# Patient Record
Sex: Male | Born: 1955 | Race: White | Hispanic: No | Marital: Single | Smoking: Never smoker
Health system: Southern US, Community
[De-identification: ages and names within clinical notes are randomized; demographics above are authoritative.]

## PROBLEM LIST (undated history)

## (undated) DIAGNOSIS — K5190 Ulcerative colitis, unspecified, without complications: Secondary | ICD-10-CM

## (undated) DIAGNOSIS — I10 Essential (primary) hypertension: Secondary | ICD-10-CM

## (undated) DIAGNOSIS — Q909 Down syndrome, unspecified: Secondary | ICD-10-CM

## (undated) HISTORY — PX: OTHER SURGICAL HISTORY: SHX169

---

## 2005-11-17 ENCOUNTER — Ambulatory Visit: Payer: Self-pay | Admitting: Nephrology

## 2005-11-27 ENCOUNTER — Emergency Department: Payer: Self-pay | Admitting: Emergency Medicine

## 2009-05-01 ENCOUNTER — Ambulatory Visit: Payer: Self-pay | Admitting: Specialist

## 2013-09-07 ENCOUNTER — Emergency Department: Payer: Self-pay | Admitting: Emergency Medicine

## 2013-09-07 LAB — COMPREHENSIVE METABOLIC PANEL
ALBUMIN: 4.2 g/dL (ref 3.4–5.0)
ALT: 20 U/L (ref 12–78)
ANION GAP: 6 — AB (ref 7–16)
AST: 21 U/L (ref 15–37)
Alkaline Phosphatase: 100 U/L
BILIRUBIN TOTAL: 0.4 mg/dL (ref 0.2–1.0)
BUN: 12 mg/dL (ref 7–18)
CREATININE: 0.83 mg/dL (ref 0.60–1.30)
Calcium, Total: 9 mg/dL (ref 8.5–10.1)
Chloride: 102 mmol/L (ref 98–107)
Co2: 29 mmol/L (ref 21–32)
EGFR (Non-African Amer.): >60
Glucose: 116 mg/dL — ABNORMAL HIGH (ref 65–99)
OSMOLALITY: 275 (ref 275–301)
POTASSIUM: 4.3 mmol/L (ref 3.5–5.1)
Sodium: 137 mmol/L (ref 136–145)
Total Protein: 8.2 g/dL (ref 6.4–8.2)

## 2013-09-07 LAB — CBC
HCT: 49.2 % (ref 40.0–52.0)
HCT: 39.1 % — ABNORMAL LOW (ref 40.0–52.0)
HGB: 16.8 g/dL (ref 13.0–18.0)
HGB: 13.7 g/dL (ref 13.0–18.0)
MCH: 30.8 pg (ref 26.0–34.0)
MCH: 31.2 pg (ref 26.0–34.0)
MCHC: 34.3 g/dL (ref 32.0–36.0)
MCHC: 35 g/dL (ref 32.0–36.0)
MCV: 90 fL (ref 80–100)
MCV: 89 fL (ref 80–100)
Platelet: 168 10*3/uL (ref 150–440)
Platelet: 154 10*3/uL (ref 150–440)
RBC: 5.47 10*6/uL (ref 4.40–5.90)
RBC: 4.39 10*6/uL — ABNORMAL LOW (ref 4.40–5.90)
RDW: 13.5 % (ref 11.5–14.5)
RDW: 13.7 % (ref 11.5–14.5)
WBC: 9.8 10*3/uL (ref 3.8–10.6)
WBC: 17.3 10*3/uL — ABNORMAL HIGH (ref 3.8–10.6)

## 2013-09-07 LAB — PROTIME-INR
INR: 0.9
Prothrombin Time: 12.3 secs (ref 11.5–14.7)

## 2015-06-09 ENCOUNTER — Encounter: Payer: Self-pay | Admitting: Emergency Medicine

## 2015-06-09 ENCOUNTER — Observation Stay
Admission: EM | Admit: 2015-06-09 | Discharge: 2015-06-11 | Disposition: A | Discharge status: Home / Self Care | Payer: Medicare Other | Attending: Internal Medicine | Admitting: Internal Medicine

## 2015-06-09 ENCOUNTER — Emergency Department: Payer: Medicare Other

## 2015-06-09 DIAGNOSIS — Q909 Down syndrome, unspecified: Secondary | ICD-10-CM | POA: Insufficient documentation

## 2015-06-09 DIAGNOSIS — F0390 Unspecified dementia without behavioral disturbance: Secondary | ICD-10-CM | POA: Diagnosis not present

## 2015-06-09 DIAGNOSIS — I10 Essential (primary) hypertension: Secondary | ICD-10-CM | POA: Insufficient documentation

## 2015-06-09 DIAGNOSIS — Z8701 Personal history of pneumonia (recurrent): Secondary | ICD-10-CM | POA: Diagnosis not present

## 2015-06-09 DIAGNOSIS — Z79899 Other long term (current) drug therapy: Secondary | ICD-10-CM | POA: Insufficient documentation

## 2015-06-09 DIAGNOSIS — G9341 Metabolic encephalopathy: Secondary | ICD-10-CM | POA: Diagnosis not present

## 2015-06-09 DIAGNOSIS — K5190 Ulcerative colitis, unspecified, without complications: Secondary | ICD-10-CM | POA: Insufficient documentation

## 2015-06-09 DIAGNOSIS — R5383 Other fatigue: Secondary | ICD-10-CM | POA: Insufficient documentation

## 2015-06-09 DIAGNOSIS — B9620 Unspecified Escherichia coli [E. coli] as the cause of diseases classified elsewhere: Secondary | ICD-10-CM | POA: Insufficient documentation

## 2015-06-09 DIAGNOSIS — R509 Fever, unspecified: Secondary | ICD-10-CM | POA: Insufficient documentation

## 2015-06-09 DIAGNOSIS — R4182 Altered mental status, unspecified: Secondary | ICD-10-CM | POA: Insufficient documentation

## 2015-06-09 DIAGNOSIS — A419 Sepsis, unspecified organism: Secondary | ICD-10-CM | POA: Diagnosis present

## 2015-06-09 DIAGNOSIS — N390 Urinary tract infection, site not specified: Principal | ICD-10-CM | POA: Insufficient documentation

## 2015-06-09 DIAGNOSIS — G809 Cerebral palsy, unspecified: Secondary | ICD-10-CM | POA: Diagnosis not present

## 2015-06-09 HISTORY — DX: Down syndrome, unspecified: Q90.9

## 2015-06-09 HISTORY — DX: Ulcerative colitis, unspecified, without complications: K51.90

## 2015-06-09 LAB — URINALYSIS COMPLETE WITH MICROSCOPIC (ARMC ONLY)
BILIRUBIN URINE: NEGATIVE
Glucose, UA: NEGATIVE mg/dL
HGB URINE DIPSTICK: NEGATIVE
Ketones, ur: NEGATIVE mg/dL
Nitrite: NEGATIVE
PH: 6 (ref 5.0–8.0)
PROTEIN: NEGATIVE mg/dL
Specific Gravity, Urine: 1.017 (ref 1.005–1.030)

## 2015-06-09 LAB — CBC
HEMATOCRIT: 49.4 % (ref 40.0–52.0)
HEMOGLOBIN: 16.2 g/dL (ref 13.0–18.0)
MCH: 29.4 pg (ref 26.0–34.0)
MCHC: 32.9 g/dL (ref 32.0–36.0)
MCV: 89.4 fL (ref 80.0–100.0)
Platelets: 141 10*3/uL — ABNORMAL LOW (ref 150–440)
RBC: 5.52 MIL/uL (ref 4.40–5.90)
RDW: 13.9 % (ref 11.5–14.5)
WBC: 16 10*3/uL — ABNORMAL HIGH (ref 3.8–10.6)

## 2015-06-09 LAB — BASIC METABOLIC PANEL
ANION GAP: 8 (ref 5–15)
BUN: 12 mg/dL (ref 6–20)
CALCIUM: 8.9 mg/dL (ref 8.9–10.3)
CO2: 29 mmol/L (ref 22–32)
Chloride: 103 mmol/L (ref 101–111)
Creatinine, Ser: 0.68 mg/dL (ref 0.61–1.24)
GFR calc Af Amer: >60 mL/min (ref >60)
GFR calc non Af Amer: >60 mL/min (ref >60)
GLUCOSE: 130 mg/dL — AB (ref 65–99)
Potassium: 3.9 mmol/L (ref 3.5–5.1)
SODIUM: 140 mmol/L (ref 135–145)

## 2015-06-09 LAB — TROPONIN I: TROPONIN I: 0.03 ng/mL (ref <0.031)

## 2015-06-09 MED ORDER — SODIUM CHLORIDE 0.9 % IV BOLUS (SEPSIS): 500.0000 mL | Freq: Once | INTRAVENOUS | Status: DC

## 2015-06-09 MED ORDER — SODIUM CHLORIDE 0.9 % IV BOLUS (SEPSIS)
1701.0000 mL | Freq: Once | INTRAVENOUS | Status: AC
  Administered 2015-06-10: 1701 mL via INTRAVENOUS

## 2015-06-09 MED ORDER — CEFTRIAXONE SODIUM 1 G IJ SOLR
1.0000 g | Freq: Once | INTRAMUSCULAR | Status: AC
  Administered 2015-06-10: 1 g via INTRAVENOUS
  Filled 2015-06-09: qty 10

## 2015-06-09 NOTE — ED Notes (Signed)
father states pt had a fever, rapid heart rate and high blood pressure at home prior to arrival, was seen at [facility] with a low grade fever dx aspiration Pnuemonia and placed on ABX father states believes the same thing has happened

## 2015-06-09 NOTE — ED Provider Notes (Signed)
[facility] Emergency Department Provider Note  ____________________________________________  Time seen: Approximately 10:43 PM  I have reviewed the triage vital signs and the nursing notes.   HISTORY  Chief Complaint Hypertension  Caveat-history of present illness review systems Limited due to the patient's mental retardation. All information is obtained from his father/caregiver at bedside.  HPI Carlos Price is a 59 y.o. male history of ulcerative colitis, cerebral palsy, mental retardation, develop mental delay, dementia, hypertension who presents for fever and lethargy today. Father reports that the patient has had poor appetite today, has appeared fatigued, not responding than way he usually would. He developed a temperature of "100" and heart rate in the 120s as well as hypertension was concerned his father/caregiver and so he was brought to the emergency department. In the past he has had a similar presentation in the setting of aspiration pneumonia. He was treated for that last month and appeared to be doing well recently. He has had no nausea or vomiting, no perceived abdominal pain or chest pain. He has had soft stools but no diarrhea, no blood in stools.   Past Medical History  Diagnosis Date  . Ulcerative colitis (HCC)   . Down syndrome     Patient Active Problem List   Diagnosis Date Noted  . UTI (lower urinary tract infection) 06/10/2015    No past surgical history on file.  Current Outpatient Rx  Name  Route  Sig  Dispense  Refill  . mesalamine (PENTASA) 500 MG CR capsule   Oral   Take 2,000 mg by mouth 2 (two) times daily.         . mesalamine (ROWASA) 4 G enema   Rectal   Place 4 g rectally at bedtime as needed (Rectal bleeding).          Marland Kitchen spironolactone (ALDACTONE) 100 MG tablet   Oral   Take 100 mg by mouth daily. Patient has tablet compounded into 5mL suspension at [facility]         . valsartan (DIOVAN) 80 MG  tablet   Oral   Take 80-160 mg by mouth 2 (two) times daily.  in the morning and 80 in the evening           Allergies Review of patient's allergies indicates no known allergies.  No family history on file.  Social History Social History  Substance Use Topics  . Smoking status: Never Smoker   . Smokeless tobacco: Not on file  . Alcohol Use: No    Review of Systems Constitutional: + low grade fever Cardiovascular: No perceived chest pain. Respiratory: No perceived shortness of breath. Gastrointestinal: No nausea, no vomiting.  No diarrhea.   Skin: Negative for rash.   Caveat-history of present illness review systems Limited due to the patient's mental retardation. All information is obtained from his father/caregiver at bedside. ____________________________________________   PHYSICAL EXAM:  VITAL SIGNS: ED Triage Vitals  Enc Vitals Group     BP 06/09/15 1930 174/90 mmHg     Pulse Rate 06/09/15 1930 96     Resp 06/09/15 1930 18     Temp 06/09/15 1930 97.9 F (36.6 C)     Temp Source 06/09/15 1930 Oral     SpO2 06/09/15 1930 94 %     Weight 06/09/15 1930 125 lb (56.7 kg)     Height 06/09/15 1930  (1.473 m)     Head Cir --      Peak Flow --  Pain Score --      Pain Loc --      Pain Edu? --      Excl. in GC? --     Constitutional: Alert, smiles intermittently but unable to verbalize for the most part or follow commands. Eyes: Conjunctivae are normal. PERRL. EOMI. Head: Atraumatic. Nose: No congestion/rhinnorhea. Mouth/Throat: Mucous membranes are moist.  Oropharynx non-erythematous. Neck: No stridor. Cardiovascular: Normal rate, regular rhythm. Grossly normal heart sounds.  Good peripheral circulation. Respiratory: Normal respiratory effort.  No retractions. Lungs CTAB. Gastrointestinal: Soft and nontender. No distention.  No CVA tenderness. Genitourinary: deferred Musculoskeletal: No lower extremity tenderness nor edema.  No joint  effusions. Neurologic: The patient utters a few words, does not follow commands, has chronic muscle wasting and weakness in all 4 extremities which is at baseline according to his father who is at bedside. Skin:  Skin is warm, dry and intact. No rash noted.   ____________________________________________   LABS (all labs ordered are listed, but only abnormal results are displayed)  Labs Reviewed  BASIC METABOLIC PANEL - Abnormal; Notable for the following:    Glucose, Bld 130 (*)    All other components within normal limits  CBC - Abnormal; Notable for the following:    WBC 16.0 (*)    Platelets 141 (*)    All other components within normal limits  URINALYSIS COMPLETEWITH MICROSCOPIC ([facility] ONLY) - Abnormal; Notable for the following:    Color, Urine YELLOW (*)    APPearance CLOUDY (*)    Leukocytes, UA 3+ (*)    Bacteria, UA RARE (*)    Squamous Epithelial / LPF 0-5 (*)    All other components within normal limits  CULTURE, BLOOD (ROUTINE X 2)  CULTURE, BLOOD (ROUTINE X 2)  URINE CULTURE  TROPONIN I  LACTIC ACID, PLASMA  LACTIC ACID, PLASMA   ____________________________________________  EKG  ED ECG REPORT I, Gayla DossGayle, Tacarra Justo A, the attending physician, personally viewed and interpreted this ECG.   Date: 06/09/2015  EKG Time: 20:28  Rate: 93  Rhythm: normal EKG, normal sinus rhythm  Axis: normal  Intervals:none  ST&T Change: No acute ST elevation  ____________________________________________  RADIOLOGY  CXR IMPRESSION: No acute abnormality seen. ____________________________________________   PROCEDURES  Procedure(s) performed: None  Critical Care performed: Yes, see critical care note(s). Total critical care time spent 30 minutes.  ____________________________________________   INITIAL IMPRESSION / ASSESSMENT AND PLAN / ED COURSE  Pertinent labs & imaging results that were available during my care of the patient were reviewed by me and considered in my  medical decision making (see chart for details).  Carlos Price is a 59 y.o. male history of ulcerative colitis, cerebral palsy, mental retardation, develop mental delay, dementia, hypertension who presents for fever and lethargy today. On arrival to the emergency department, he is afebrile but intermittent lean tachycardic and tachypnea with elevated white blood cell count at 16,000. According to his father, he is at his baseline in terms of mental status. He is maintaining adequate blood pressure. Normal BMP. Negative troponin. Urinalysis is concerning for urinary tract infection and I suspect sepsis secondary to urinary tract infection. I rechecked ceftriaxone and liberal IV fluids ordered. Chest x-ray clear. Case discussed with the hospitalist, Dr. Clint GuyHower, for admission. ____________________________________________   FINAL CLINICAL IMPRESSION(S) / ED DIAGNOSES  Final diagnoses:  Sepsis secondary to UTI (HCC)      Gayla DossEryka A Jontrell Bushong, MD 06/10/15 [phone]

## 2015-06-10 ENCOUNTER — Encounter: Payer: Self-pay | Admitting: Internal Medicine

## 2015-06-10 DIAGNOSIS — N390 Urinary tract infection, site not specified: Secondary | ICD-10-CM | POA: Diagnosis present

## 2015-06-10 LAB — CBC
HCT: 41.7 % (ref 40.0–52.0)
Hemoglobin: 14 g/dL (ref 13.0–18.0)
MCH: 30 pg (ref 26.0–34.0)
MCHC: 33.5 g/dL (ref 32.0–36.0)
MCV: 89.7 fL (ref 80.0–100.0)
Platelets: 131 10*3/uL — ABNORMAL LOW (ref 150–440)
RBC: 4.65 MIL/uL (ref 4.40–5.90)
RDW: 13.9 % (ref 11.5–14.5)
WBC: 15.5 10*3/uL — ABNORMAL HIGH (ref 3.8–10.6)

## 2015-06-10 LAB — CREATININE, SERUM
Creatinine, Ser: 0.69 mg/dL (ref 0.61–1.24)
GFR calc Af Amer: >60 mL/min (ref >60)
GFR calc non Af Amer: >60 mL/min (ref >60)

## 2015-06-10 LAB — LACTIC ACID, PLASMA
Lactic Acid, Venous: 1.1 mmol/L (ref 0.5–2.0)
Lactic Acid, Venous: 1.9 mmol/L (ref 0.5–2.0)

## 2015-06-10 LAB — TSH: TSH: 1.042 u[IU]/mL (ref 0.350–4.500)

## 2015-06-10 MED ORDER — MESALAMINE 4 G RE ENEM
4.0000 g | ENEMA | Freq: Every evening | RECTAL | Status: DC | PRN
  Filled 2015-06-10: qty 60

## 2015-06-10 MED ORDER — ACETAMINOPHEN 650 MG RE SUPP: 650.0000 mg | Freq: Four times a day (QID) | RECTAL | Status: DC | PRN

## 2015-06-10 MED ORDER — MORPHINE SULFATE (PF) 2 MG/ML IV SOLN: 1.0000 mg | INTRAVENOUS | Status: DC | PRN

## 2015-06-10 MED ORDER — ONDANSETRON HCL 4 MG PO TABS: 4.0000 mg | ORAL_TABLET | Freq: Four times a day (QID) | ORAL | Status: DC | PRN

## 2015-06-10 MED ORDER — IRBESARTAN 150 MG PO TABS
150.0000 mg | ORAL_TABLET | Freq: Every day | ORAL | Status: DC
  Administered 2015-06-10 – 2015-06-11 (×2): 150 mg via ORAL
  Filled 2015-06-10: qty 1

## 2015-06-10 MED ORDER — ONDANSETRON HCL 4 MG/2ML IJ SOLN: 4.0000 mg | Freq: Four times a day (QID) | INTRAMUSCULAR | Status: DC | PRN

## 2015-06-10 MED ORDER — DEXTROSE 5 % IV SOLN
1.0000 g | INTRAVENOUS | Status: DC
  Administered 2015-06-10: 1 g via INTRAVENOUS
  Filled 2015-06-10 (×2): qty 10

## 2015-06-10 MED ORDER — LABETALOL HCL 5 MG/ML IV SOLN
20.0000 mg | INTRAVENOUS | Status: DC | PRN
  Administered 2015-06-10: 20 mg via INTRAVENOUS
  Filled 2015-06-10 (×2): qty 4

## 2015-06-10 MED ORDER — ACETAMINOPHEN 325 MG PO TABS
650.0000 mg | ORAL_TABLET | Freq: Four times a day (QID) | ORAL | Status: DC | PRN
  Filled 2015-06-10: qty 2

## 2015-06-10 MED ORDER — IRBESARTAN 150 MG PO TABS: 75.0000 mg | ORAL_TABLET | Freq: Every evening | ORAL | Status: DC

## 2015-06-10 MED ORDER — SODIUM CHLORIDE 0.9 % IV SOLN
INTRAVENOUS | Status: DC
  Administered 2015-06-10 (×2): via INTRAVENOUS

## 2015-06-10 MED ORDER — SPIRONOLACTONE 5 MG/ML ORAL SUSPENSION
100.0000 mg | Freq: Every day | ORAL | Status: DC
  Administered 2015-06-10 – 2015-06-11 (×2): 100 mg via ORAL
  Filled 2015-06-10 (×2): qty 20

## 2015-06-10 MED ORDER — MESALAMINE ER 250 MG PO CPCR
2000.0000 mg | ORAL_CAPSULE | Freq: Two times a day (BID) | ORAL | Status: DC
  Administered 2015-06-10 – 2015-06-11 (×2): 2000 mg via ORAL
  Filled 2015-06-10 (×6): qty 8

## 2015-06-10 MED ORDER — HEPARIN SODIUM (PORCINE) 5000 UNIT/ML IJ SOLN
5000.0000 [IU] | Freq: Three times a day (TID) | INTRAMUSCULAR | Status: DC
  Administered 2015-06-10 – 2015-06-11 (×3): 5000 [IU] via SUBCUTANEOUS
  Filled 2015-06-10 (×3): qty 1

## 2015-06-10 MED ORDER — IRBESARTAN 75 MG PO TABS
75.0000 mg | ORAL_TABLET | Freq: Every evening | ORAL | Status: DC
  Administered 2015-06-10 (×2): 75 mg via ORAL
  Filled 2015-06-10 (×3): qty 1

## 2015-06-10 MED ORDER — SPIRONOLACTONE 25 MG PO TABS: 100.0000 mg | ORAL_TABLET | Freq: Every day | ORAL | Status: DC

## 2015-06-10 NOTE — Evaluation (Signed)
Clinical/Bedside Swallow Evaluation Patient Details  Name: Carlos Price MRN: [id number] Date of Birth: 1956-05-25  Today's Date: 06/10/2015 Time: SLP Start Time (ACUTE ONLY): 0845 SLP Stop Time (ACUTE ONLY): 0945 SLP Time Calculation (min) (ACUTE ONLY): 60 min  Past Medical History:  Past Medical History  Diagnosis Date  . Ulcerative colitis (HCC)   . Down syndrome     more likley CP   Past Surgical History:  Past Surgical History  Procedure Laterality Date  . None     HPI:  Pt presented to the emergency department with his father who is his primary caregiver due to his cerebral palsy and down syndrome, per chart notes. His father was concerned the patient was more lethargic today than usual and not acting himself. His father reports low-grade fever of more than 100F. He also states the patient has not been eating well but denies vomiting or diarrhea. In the emergency department the patient was found to have urinary tract infection as well as uncontrolled hypertension. Pt's diet has been modified to a Honey consistency liquids and puree diet per Father, his caregiver, who described pt having dysphagia at home w/ his diet in the past year.    Assessment / Plan / Recommendation Clinical Impression  Pt appeared to present w/ moderate oropharyngeal phase dysphagia w/ a significantly slow swallow response/completion w/ trials of puree and Honey consistency liquids fed to him via TSP. Pt required verbal cues and encouragement during feeding of trials; slow oral phase management noted as well. Pt appears at increased risk for aspiration sec. to his baseline presentation including cerebral palsy and down syndrome and oropharyngeal phase dysphagia. Rec. continue w/ current dysphagia diet as ordered w/ strict aspiration precautions and meds in puree - crushed as able. Pt will require feeding assistance at all meals.     Aspiration Risk  Moderate aspiration risk    Diet Recommendation   Dysphagia 1(puree) diet w/ Honey consistency liquids; strict aspiration precautions; feeding assistance   Medication Administration: Crushed with puree    Other  Recommendations Recommended Consults:  (dietician consult) Oral Care Recommendations: Oral care BID;Staff/trained caregiver to provide oral care Other Recommendations: Order thickener from pharmacy;Prohibited food (jello, ice cream, thin soups);Remove water pitcher   Follow up Recommendations  Skilled Nursing facility (education; monitoring of diet toleration)    Frequency and Duration min 3x week  1 week       Prognosis Prognosis for Safe Diet Advancement: Fair Barriers to Reach Goals: Cognitive deficits;Severity of deficits Barriers/Prognosis Comment: pt appears close to his baseline per Father's description      Swallow Study   General Date of Onset: 06/09/15 HPI: Pt presented to the emergency department with his father who is his primary caregiver due to his cerebral palsy and down syndrome, per chart notes. His father was concerned the patient was more lethargic today than usual and not acting himself. His father reports low-grade fever of more than 100F. He also states the patient has not been eating well but denies vomiting or diarrhea. In the emergency department the patient was found to have urinary tract infection as well as uncontrolled hypertension. Pt's diet has been modified to a Honey consistency liquids and puree diet per Father, his caregiver, who described pt having dysphagia at home w/ his diet in the past year.  Type of Study: Bedside Swallow Evaluation Previous Swallow Assessment: none reported by father when he arrived later Diet Prior to this Study: Dysphagia 1 (puree);Honey-thick liquids Temperature Spikes Noted: No (  wbc 15.5) Respiratory Status: Room air History of Recent Intubation: No Behavior/Cognition: Cooperative;Confused;Distractible;Requires cueing (awake) Oral Cavity Assessment: Within  Functional Limits (limited assessment) Oral Care Completed by SLP: Yes Oral Cavity - Dentition: Adequate natural dentition Self-Feeding Abilities: Total assist Patient Positioning: Upright in bed Baseline Vocal Quality:  (minimal mumbled verbalizations) Volitional Cough: Cognitively unable to elicit Volitional Swallow: Unable to elicit    Oral/Motor/Sensory Function Overall Oral Motor/Sensory Function:  (unable to formally assess sec. to pt's declined Cognition)   Ice Chips Ice chips: Impaired Presentation: Spoon (fed; 3 trials) Oral Phase Impairments: Reduced lingual movement/coordination Oral Phase Functional Implications: Prolonged oral transit Pharyngeal Phase Impairments: Suspected delayed Swallow (no coughing noted)   Thin Liquid Thin Liquid: Not tested    Nectar Thick Nectar Thick Liquid: Not tested   Honey Thick Honey Thick Liquid: Impaired Presentation: Spoon (fed; 8 trials) Oral Phase Impairments: Reduced lingual movement/coordination Oral Phase Functional Implications: Prolonged oral transit Pharyngeal Phase Impairments: Suspected delayed Swallow (no coughing noted) Other Comments: slow swallow response w/ all trials   Puree Puree: Impaired Oral Phase Impairments: Reduced labial seal;Reduced lingual movement/coordination Oral Phase Functional Implications: Prolonged oral transit Pharyngeal Phase Impairments: Suspected delayed Swallow (no overt coughing) Other Comments: slow swallow response w/ all trials   Solid Solid: Not tested      Jerilynn Som, MS, CCC-SLP  Watson,Katherine 06/10/2015,3:32 PM

## 2015-06-10 NOTE — Progress Notes (Signed)
[facility] - [place] at [facility]   PATIENT NAME: Carlos Price    MR#:  [id number]  DATE OF BIRTH:  [birth year]  SUBJECTIVE:  CHIEF COMPLAINT:   Chief Complaint  Patient presents with  . Hypertension    father states pt had a fever, rapid heart rate and high blood pressure at home prior to arrival, was seen at the [facility] clinic with a low grade fever dx aspiration Pnuemonia and placed on ABX father states believes the same thing has happened   - Patient with Down's syndrome admitted with diagnosis of sepsis. Patient is more alert and close to baseline. Does not interact much at baseline. He is nonverbal and bedbound. -No fevers noted here. REVIEW OF SYSTEMS:  Review of Systems  Unable to perform ROS: patient nonverbal    DRUG ALLERGIES:  No Known Allergies  VITALS:  Blood pressure 149/76, pulse 92, temperature 98.4 F (36.9 C), temperature source Oral, resp. rate 18, height  (1.473 m), weight 62.37 kg (137 lb 8 oz), SpO2 92 %.  PHYSICAL EXAMINATION:  Physical Exam  GENERAL:  59 y.o.-year-old patient lying in the bed with no acute distress. Has Down syndrome EYES: Pupils equal, round, reactive to light and accommodation. No scleral icterus. Extraocular muscles intact.  HEENT: Head atraumatic, normocephalic. Oropharynx and nasopharynx clear.  NECK:  Supple, no jugular venous distention. No thyroid enlargement, no tenderness.  LUNGS: Normal breath sounds bilaterally, no wheezing, rales,rhonchi or crepitation. Decreased bibasilar breath sounds No use of accessory muscles of respiration.  CARDIOVASCULAR: S1, S2 normal. No murmurs, rubs, or gallops.  ABDOMEN: Soft, nontender, nondistended. Bowel sounds present. No organomegaly or mass.  EXTREMITIES: No pedal edema, cyanosis, or clubbing.  NEUROLOGIC: Bedbound at baseline. Able to move both upper extremities. No new focal motor or neuro deficits PSYCHIATRIC: The patient is alert but not oriented.   SKIN: No obvious rash, lesion, or ulcer.    LABORATORY PANEL:   CBC  Recent Labs Lab 06/10/15 0453  WBC 15.5*  HGB 14.0  HCT 41.7  PLT 131*   ------------------------------------------------------------------------------------------------------------------  Chemistries   Recent Labs Lab 06/09/15 2025 06/10/15 0453  NA 140  --   K 3.9  --   CL 103  --   CO2 29  --   GLUCOSE 130*  --   BUN 12  --   CREATININE 0.68 0.69  CALCIUM 8.9  --    ------------------------------------------------------------------------------------------------------------------  Cardiac Enzymes  Recent Labs Lab 06/09/15 2025  TROPONINI 0.03   ------------------------------------------------------------------------------------------------------------------  RADIOLOGY:  Dg Chest 2 View  06/09/2015  CLINICAL DATA:  Fevers EXAM: CHEST - 2 VIEW COMPARISON:  None. FINDINGS: Cardiac shadow is within normal limits. Lungs are well aerated bilaterally. No focal infiltrate or sizable effusion is seen. No bony abnormality is noted. IMPRESSION: No acute abnormality seen. Electronically Signed   By: Alcide Clever M.D.   On: 06/09/2015 21:26    EKG:   Orders placed or performed during the hospital encounter of 06/09/15  . ED EKG within 10 minutes  . ED EKG within 10 minutes  . EKG 12-Lead  . EKG 12-Lead    ASSESSMENT AND PLAN:   59 year old male with the past medical history significant for Down syndrome, ulcerative colitis, bedbound and nonverbal at baseline presents to the hospital secondary to altered mental status.  #1 acute metabolic encephalopathy-secondary to infection, likely source urine. -Chest x-ray with no acute infiltrate noted. -Follow blood and urine cultures. -On Rocephin. Can be changed to  oral antibiotics once patient is completely alert. -Advised probiotics with the antibiotics at discharge -Monitor fever curve.  #2 hypertension-will restart patient's home medications. On  ARB and Aldactone. -Blood pressure borderline elevated  #3 ulcerative colitis history-on Pentasa and also mesalamine enemas when necessary  #4 DVT prophylaxis-on subcutaneous heparin  Family wants placement. Possible discharge home with home health and the social worker and can be placed from home. Discussed with father, Futures tradercare manager and also Child psychotherapistsocial worker.  All the records are reviewed and case discussed with Care Management/Social Workerr. Management plans discussed with the patient, family and they are in agreement.  CODE STATUS: Full code  TOTAL TIME TAKING CARE OF THIS PATIENT: 36 minutes.   POSSIBLE D/C IN 1-2 DAYS, DEPENDING ON CLINICAL CONDITION.   Enid BaasKALISETTI,Maccoy Haubner M.D on 06/10/2015 at 4:05 PM  Between 7am to 6pm - Pager - [phone]  After 6pm go to [email or web address] - password [email or web address] [facility]  [facility]  Office  [phone]  CC: Primary care physician; No primary care provider on file.

## 2015-06-10 NOTE — Clinical Social Work Note (Signed)
Clinical Social Work Assessment  Patient Details  Name: Carlos Price MRN: [id number] Date of Birth: 1955/08/30  Date of referral:  06/10/15               Reason for consult:  Facility Placement ([place] )                Permission sought to share information with:  Chartered certified accountant granted to share information::  Yes, Verbal Permission Granted  Name::      Carlos::   Price   Relationship::     Contact Information:     Housing/Transportation Living arrangements for the past 2 months:  [name] of Information:  Parent Patient Interpreter Needed:  None Criminal Activity/Legal Involvement Pertinent to Current Situation/Hospitalization:  No - Comment as needed Significant Relationships:  Parents Lives with:  Parents Do you feel safe going back to the place where you live?   (unable to assess) Need for family participation in patient care:  Yes (Comment)  Care giving concerns: Patient lives with his father Carlos Price in [place].    Social Worker assessment / plan: Holiday representative (CSW) received verbal consult from RN Case Manager that patient's father is interested in SNF placement. CSW met with patient and his father Carlos Price [phone] was at bedside. CSW introduced self and explained role of CSW department. Patient opened his eyes and reached his hand out to [name]. Patient did not state his name. Patient has a diagnosis of down syndrome. Per father he is the primary caregiver for patient and they live in [place]. Per father he hired a Quarry manager for 3 days a week (Wed/Thur/Sat) for 4 hours to assist patient with baths and other ADL's. Father reported that patient has not been able to walk for 1.5 years. Father reported that he does not think patient would benefit from PT. Father expressed concerns that patient's medical problems are becoming more complex and he may need to place him in a  SNF. CSW explained that patient is under Medicare observation, so Medicare will not cover short term rehab this admission. CSW also explained that if patient would not benefit from PT then Medicare would not cover short term rehab under an inpatient admission. CSW explained that patient's Medicaid would cover long term care at a SNF. CSW explained that if patient is placed from the hospital a search for a Medicaid bed would have to extend outside of [facility]. Father reported that he does not want patient to go outside of [facility] and is interested in [facility]. Per father [facility] is close to their home. CSW also explained that patient would require a PASARR number before placement. Due to patient's MR diagnosis a PASARR professional would have to come out and evaluate patient in person. Father verbalized his understanding. SNF list was provided.  Father reported that he wants to take patient home from the hospital with home health and will follow up with the facilities for long term care placement. Father gave CSW permission to initiate SNF referral. CSW completed FL2, started PASARR and faxed out. CSW left Armed forces operational officer at [facility] a voicemail. RN Case Manager aware of above.       Employment status:  Disabled (Comment on whether or not currently receiving Disability) (Receives SSI/Disability ) Insurance information:  Medicare, Medicaid In [employer] (Medicare Observation) PT Recommendations:  Not assessed at this time Information /  Referral to community resources:  [place], Other (Comment Required) (RN Case Manager will arrange home health )  Patient/Family's Response to care: Patient's father prefers to take patient home and follow up on long term care placement.   Patient/Family's Understanding of and Emotional Response to Diagnosis, Current Treatment, and Prognosis: Patient and father were pleasant.   Emotional Assessment Appearance:   Appears stated age Attitude/Demeanor/Rapport:    Affect (typically observed):  Pleasant Orientation:  Fluctuating Orientation (Suspected and/or reported Sundowners) Alcohol / Substance use:  Not Applicable Psych involvement (Current and /or in the community):  No (Comment)  Discharge Needs  Concerns to be addressed:  Discharge Planning Concerns Readmission within the last 30 days:  No Current discharge risk:  Cognitively Impaired Barriers to Discharge:  Continued Medical Work up   Loralyn Freshwater, LCSW 06/10/2015, 11:14 AM

## 2015-06-10 NOTE — Care Management Obs Status (Signed)
MEDICARE OBSERVATION STATUS NOTIFICATION   Patient Details  Name: Carlos LabJimmie Price MRN: [id number] Date of Birth: May 10, 1956   Medicare Observation Status Notification Given:  Yes    Collie Siadngela Akiya Morr, RN 06/10/2015, 2:54 PM

## 2015-06-10 NOTE — Progress Notes (Signed)
Disregard previous note. Entered on wrong patient.

## 2015-06-10 NOTE — Care Management Note (Signed)
Case Management Note  Patient Details  Name: Carlos Price MRN: [id number] Date of Birth: January 31, 1956  Subjective/Objective:                  Met with patient and his elderly father. Patient was not able to be involved in conversation due to baseline mental status. Patient lives with his father who provides assistance to patient with mobility and ADLs. Patient mother died in 08-Sep-2008 related to a neurological disease also. Father states as patient gets older he becomes more dependent on him and "it gets tough sometimes". He has a hospital bed that belonged to his wife that he'd like to move so that patient can use. Ideally he would like for patient to return home. He pays a "CNA" privately to assist him in the home. Father states "patient can ride in a car". He states patient cannot walk. His PCP is Dr. Nicky Pugh. Father would like more information related to skilled nursing placement.  Action/Plan: List of home health agencies left with father. CSW will speak with father about placement.   Expected Discharge Date:  06/12/15               Expected Discharge Plan:     In-House Referral:     Discharge planning Services  CM Consult  Post Acute Care Choice:  Home Health Choice offered to:  Parent  DME Arranged:    DME Agency:     HH Arranged:    [place] Agency:     Status of Service:  In process, will continue to follow  Medicare Important Message Given:    Date Medicare IM Given:    Medicare IM give by:    Date Additional Medicare IM Given:    Additional Medicare Important Message give by:     If discussed at [place] of Stay Meetings, dates discussed:    Additional Comments:  Marshell Garfinkel, RN 06/10/2015, 9:54 AM

## 2015-06-10 NOTE — [place] FL2 (Signed)
[place] MEDICAID FL2 LEVEL OF CARE SCREENING TOOL     IDENTIFICATION  Patient Name: Carlos LabJimmie King Birthdate: [birth year] Sex: male Admission Date (Current Location): 06/09/2015  [place]  [facility]  ([id number]) Facility and Address:  [facility], [address], [place], [place] [address]      Provider Number: [id number]  Attending Physician Name and Address:  Enid Baasadhika Kalisetti, MD  Relative Name and Phone Number:       Current Level of Care: Hospital Recommended Level of Care: Skilled Nursing Facility Prior Approval Number:    Date Approved/Denied:   PASRR Number:    Discharge Plan: SNF    Current Diagnoses: Patient Active Problem List   Diagnosis Date Noted  . UTI (lower urinary tract infection) 06/10/2015  Down Syndrome   Orientation ACTIVITIES/SOCIAL BLADDER RESPIRATION       Passive Continent Normal  BEHAVIORAL SYMPTOMS/MOOD NEUROLOGICAL BOWEL NUTRITION STATUS   (none )  (none ) Continent Diet (Diet: DYS 1: Honey Thick)  PHYSICIAN VISITS COMMUNICATION OF NEEDS Height & Weight Skin  30 days Verbally 4\' 10"  (147.3 cm) 137 lbs. Normal          AMBULATORY STATUS RESPIRATION    Assist extensive Normal      Personal Care Assistance Level of Assistance  Bathing, Feeding, Dressing, Total care Bathing Assistance: Maximum assistance Feeding assistance: Maximum assistance Dressing Assistance: Maximum assistance Total Care Assistance: Maximum assistance    Functional Limitations Info  Sight, Hearing, Speech Sight Info: Adequate Hearing Info: Adequate Speech Info: Adequate       SPECIAL CARE FACTORS FREQUENCY                      Additional Factors Info  Code Status Code Status Info:  (Full Code. )             Current Medications (06/10/2015):  This is the current hospital active medication list Current Facility-Administered Medications  Medication Dose Route Frequency Provider  Last Rate Last Dose  . 0.9 %  sodium chloride infusion   Intravenous Continuous Arnaldo NatalMichael S Diamond, MD 100 mL/hr at 06/10/15 0235    . acetaminophen (TYLENOL) tablet 650 mg  650 mg Oral Q6H PRN Arnaldo NatalMichael S Diamond, MD       Or  . acetaminophen (TYLENOL) suppository 650 mg  650 mg Rectal Q6H PRN Arnaldo NatalMichael S Diamond, MD      . cefTRIAXone (ROCEPHIN) 1 g in dextrose 5 % 50 mL IVPB  1 g Intravenous Q24H Arnaldo NatalMichael S Diamond, MD      . heparin injection 5,000 Units  5,000 Units Subcutaneous 3 times per day Arnaldo NatalMichael S Diamond, MD   5,000 Units at 06/10/15 0505  . irbesartan (AVAPRO) tablet 150 mg  150 mg Oral Daily Arnaldo NatalMichael S Diamond, MD   150 mg at 06/10/15 1026  . irbesartan (AVAPRO) tablet 75 mg  75 mg Oral QPM Arnaldo NatalMichael S Diamond, MD   75 mg at 06/10/15 0230  . labetalol (NORMODYNE,TRANDATE) injection 20 mg  20 mg Intravenous Q2H PRN Arnaldo NatalMichael S Diamond, MD   20 mg at 06/10/15 0104  . mesalamine (PENTASA) CR capsule 2,000 mg  2,000 mg Oral BID Arnaldo NatalMichael S Diamond, MD   2,000 mg at 06/10/15 1026  . mesalamine (ROWASA) enema 4 g  4 g Rectal QHS PRN Arnaldo NatalMichael S Diamond, MD      . morphine 2 MG/ML injection 1 mg  1 mg Intravenous Q3H PRN Arnaldo NatalMichael S Diamond,  MD      . ondansetron (ZOFRAN) tablet 4 mg  4 mg Oral Q6H PRN Arnaldo Natal, MD       Or  . ondansetron [facility]) injection 4 mg  4 mg Intravenous Q6H PRN Arnaldo Natal, MD      . spironolactone (ALDACTONE) 5 mg/mL oral suspension 100 mg  100 mg Oral Daily Arnaldo Natal, MD   100 mg at 06/10/15 1026     Discharge Medications: Please see discharge summary for a list of discharge medications.  Relevant Imaging Results:  Relevant Price Results:  Recent Labs    Additional Information  (SSN: [id number])  Haig Prophet, LCSW

## 2015-06-10 NOTE — Progress Notes (Signed)
pts cbg 48.  Pt asymptomatic.  Given juice and snack.  cbg increased to 110.  notifed md. Cont to monitor.

## 2015-06-10 NOTE — Progress Notes (Signed)
Initial Nutrition Assessment   INTERVENTION:   Meals and Snacks: Cater to patient preferences Medical Food Supplement Therapy: will recommend honey thick Mighty Shakes on meal trays TID and Magic Cup BID for added nutrition (each supplement provides approximately 300kcals and 9g protein)   NUTRITION DIAGNOSIS:   Swallowing difficulty related to dysphagia as evidenced by  (SLP following, current diet order).  GOAL:   Patient will meet greater than or equal to 90% of their needs  MONITOR:    (Energy Intake, Digestive System, Anthropometrics)  REASON FOR ASSESSMENT:    (Diet Order)    ASSESSMENT:   Pt admitted with UTI and fever. Pt with h/o CP and UC. Pt a feeder requiring assistance at meal times.  Past Medical History  Diagnosis Date  . Ulcerative colitis (HCC)   . Down syndrome     more likley CP    Diet Order:  DIET - DYS 1 Room service appropriate?: Yes; Fluid consistency:: Honey Thick    Current Nutrition: Pt father reports pt ate 2/3 of lunch today and tolerated well, father ate the rest of the Magic Cup on visit.   Food/Nutrition-Related History: Pt father reports pt ate well PTA 3 meals per day with a good appetite although usually lunch was smaller than breakfast or dinner. Pt's father reports recently starting to puree all of pt's foods and that pt was tolerating well.   Scheduled Medications:  . cefTRIAXone (ROCEPHIN)  IV  1 g Intravenous Q24H  . heparin  5,000 Units Subcutaneous 3 times per day  . irbesartan  150 mg Oral Daily  . irbesartan  75 mg Oral QPM  . mesalamine  2,000 mg Oral BID  . spironolactone  100 mg Oral Daily    Continuous Medications:  . sodium chloride 100 mL/hr at 06/10/15 1316     Electrolyte/Renal Profile and Glucose Profile:   Recent Labs Lab 06/09/15 2025 06/10/15 0453  NA 140  --   K 3.9  --   CL 103  --   CO2 29  --   BUN 12  --   CREATININE 0.68 0.69  CALCIUM 8.9  --   GLUCOSE 130*  --    Protein Profile:  No results for input(s): ALBUMIN in the last 168 hours.  Gastrointestinal Profile: Last BM:  06/10/2015   Weight Change: Pt father reports stable weight of 125lbs. RD notes current weight of 137lbs.   Skin:  Reviewed, no issues  Last BM:  06/10/2015  Height:   Ht Readings from Last 1 Encounters:  06/10/15 4\' 10"  (1.473 m)    Weight:   Wt Readings from Last 1 Encounters:  06/10/15 137 lb 8 oz (62.37 kg)     BMI:  Body mass index is 28.75 kg/(m^2).   EDUCATION NEEDS:   No education needs identified at this time   LOW Care Level  Leda QuailAllyson Takeia Ciaravino, [place], LDN Pager [phone]

## 2015-06-10 NOTE — H&P (Signed)
Carlos Price is an 59 y.o. male.   Chief Complaint: Lethargy HPI: The patient presents to the emergency department with his father who is his primary caregiver due to his developmental disorder. His father was concerned the patient was more lethargic today than usual and not acting himself. His father reports low-grade fever of more than 100F. He also states the patient has not been eating well but denies vomiting or diarrhea. In the emergency department the patient was found to have urinary tract infection as well as uncontrolled hypertension. Due to the new onset of this infection and his overall debility the emergency department staff called for admission.  Past Medical History  Diagnosis Date  . Ulcerative colitis (House)   . Down syndrome     more likley CP    Past Surgical History  Procedure Laterality Date  . None      Family History  Problem Relation Age of Onset  . Muscular dystrophy Mother    Social History:  reports that he has never smoked. He does not have any smokeless tobacco history on file. He reports that he does not drink alcohol. His drug history is not on file.  Allergies: No Known Allergies  Medications Prior to Admission  Medication Sig Dispense Refill  . mesalamine (PENTASA) 500 MG CR capsule Take 2,000 mg by mouth 2 (two) times daily.    . mesalamine (ROWASA) 4 G enema Place 4 g rectally at bedtime as needed (Rectal bleeding).     Marland Kitchen spironolactone (ALDACTONE) 100 MG tablet Take 100 mg by mouth daily. [facility]   . valsartan (DIOVAN) 80 MG tablet Take 80-160 mg by mouth 2 (two) times daily. 1647min the morning and 80 in the evening      Results for orders placed or performed during the hospital encounter of 06/09/15 (from the past 48 hour(s))  Basic metabolic panel     Status: Abnormal   Collection Time: 06/09/15  8:25 PM  Result Value Ref Range   Sodium 140 135 - 145 mmol/L   Potassium 3.9 3.5  - 5.1 mmol/L   Chloride 103 101 - 111 mmol/L   CO2 29 22 - 32 mmol/L   Glucose, Bld 130 (H) 65 - 99 mg/dL   BUN 12 6 - 20 mg/dL   Creatinine, Ser 0.68 0.61 - 1.24 mg/dL   Calcium 8.9 8.9 - 10.3 mg/dL   GFR calc non Af Amer >60 >60 mL/min   GFR calc Af Amer >60 >60 mL/min    Comment: (NOTE) The eGFR has been calculated using the CKD EPI equation. This calculation has not been validated in all clinical situations. eGFR's persistently <60 mL/min signify possible Chronic Kidney Disease.    Anion gap 8 5 - 15  CBC     Status: Abnormal   Collection Time: 06/09/15  8:25 PM  Result Value Ref Range   WBC 16.0 (H) 3.8 - 10.6 K/uL   RBC 5.52 4.40 - 5.90 MIL/uL   Hemoglobin 16.2 13.0 - 18.0 g/dL   HCT 49.4 40.0 - 52.0 %   MCV 89.4 80.0 - 100.0 fL   MCH 29.4 26.0 - 34.0 pg   MCHC 32.9 32.0 - 36.0 g/dL   RDW 13.9 11.5 - 14.5 %   Platelets 141 (L) 150 - 440 K/uL  Troponin I     Status: None   Collection Time: 06/09/15  8:25 PM  Result Value Ref Range  Troponin I 0.03 <0.031 ng/mL    Comment:        NO INDICATION OF MYOCARDIAL INJURY.   Urinalysis complete, with microscopic ([facility] only)     Status: Abnormal   Collection Time: 06/09/15  8:25 PM  Result Value Ref Range   Color, Urine YELLOW (A) YELLOW   APPearance CLOUDY (A) CLEAR   Glucose, UA NEGATIVE NEGATIVE mg/dL   Bilirubin Urine NEGATIVE NEGATIVE   Ketones, ur NEGATIVE NEGATIVE mg/dL   Specific Gravity, Urine 1.017 1.005 - 1.030   Hgb urine dipstick NEGATIVE NEGATIVE   pH 6.0 5.0 - 8.0   Protein, ur NEGATIVE NEGATIVE mg/dL   Nitrite NEGATIVE NEGATIVE   Leukocytes, UA 3+ (A) NEGATIVE   RBC / HPF 6-30 0 - 5 RBC/hpf   WBC, UA TOO NUMEROUS TO COUNT 0 - 5 WBC/hpf   Bacteria, UA RARE (A) NONE SEEN   Squamous Epithelial / LPF 0-5 (A) NONE SEEN   Mucous PRESENT   Lactic acid, plasma     Status: None   Collection Time: 06/10/15 12:02 AM  Result Value Ref Range   Lactic Acid, Venous 1.1 0.5 - 2.0 mmol/L   Dg Chest 2  View  06/09/2015  CLINICAL DATA:  Fevers EXAM: CHEST - 2 VIEW COMPARISON:  None. FINDINGS: Cardiac shadow is within normal limits. Lungs are well aerated bilaterally. No focal infiltrate or sizable effusion is seen. No bony abnormality is noted. IMPRESSION: No acute abnormality seen. Electronically Signed   By: Inez Catalina M.D.   On: 06/09/2015 21:26    Review of Systems  Constitutional: Positive for fever and malaise/fatigue. Negative for chills.  HENT: Negative for sore throat and tinnitus.   Eyes: Negative for blurred vision and redness.  Respiratory: Negative for cough and shortness of breath.   Cardiovascular: Negative for chest pain, palpitations, orthopnea and PND.  Gastrointestinal: Negative for nausea, vomiting, abdominal pain and diarrhea.  Genitourinary: Negative for dysuria, urgency and frequency.  Musculoskeletal: Negative for myalgias and joint pain.  Skin: Negative for rash.       No lesions  Neurological: Negative for speech change, focal weakness and weakness.  Endo/Heme/Allergies: Does not bruise/bleed easily.       No temperature intolerance  Psychiatric/Behavioral: Negative for depression and suicidal ideas.    Blood pressure 180/82, pulse 88, temperature 98.6 F (37 C), temperature source Oral, resp. rate 18, height '4\' 10"'  (1.473 m), weight 56.7 kg (125 lb), SpO2 93 %. Physical Exam  Nursing note and vitals reviewed. Constitutional: He is oriented to person, place, and time. He appears well-developed and well-nourished. No distress.  HENT:  Head: Normocephalic and atraumatic.  Mouth/Throat: Oropharynx is clear and moist.  Eyes: Conjunctivae and EOM are normal. Pupils are equal, round, and reactive to light. No scleral icterus.  Neck: Normal range of motion. Neck supple. No JVD present. No tracheal deviation present. No thyromegaly present.  Cardiovascular: Normal rate, regular rhythm and normal heart sounds.  Exam reveals no gallop and no friction rub.   No  murmur heard. Respiratory: Effort normal and breath sounds normal. No respiratory distress.  GI: Soft. Bowel sounds are normal. He exhibits no distension. There is no tenderness.  Genitourinary:  Deferred  Musculoskeletal: Normal range of motion. He exhibits no edema.  Lymphadenopathy:    He has no cervical adenopathy.  Neurological: He is alert and oriented to person, place, and time. No cranial nerve deficit.  Skin: Skin is warm and dry. No rash noted. No erythema.  Psychiatric: He has a normal mood and affect. His behavior is normal. Judgment and thought content normal.     Assessment/Plan This is a 59 year old Caucasian male with cerebral palsy admitted for urinary tract infection and uncontrolled hypertension. 1. Urinary tract infection: The patient is being given Rocephin in the emergency department. Blood cultures and urine cultures have been obtained over the patient does not technically meet criteria for sepsis. We will continue IV antibiotics and transition the patient to oral medication prior to discharge home. 2. Hypertension: Uncontrolled in the emergency department although the patient's father reports that he has missed his evening dose of ARB. I have ordered labetalol as needed for systolic blood pressure greater then 180. We will try to relabel the patient's liquid spironolactone for Hospital use as this has significantly helped control his blood pressure in the past. 3. Inflammatory bowel disease: Continue mesalamine 4. DVT prophylaxis: Heparin 5. GI prophylaxis: None as the patient is not critically ill The patient is a full code. Time spent on admission orders and patient care approximately 45 minutes  Harrie Foreman 06/10/2015, 2:18 AM

## 2015-06-11 DIAGNOSIS — N390 Urinary tract infection, site not specified: Secondary | ICD-10-CM | POA: Diagnosis not present

## 2015-06-11 MED ORDER — LEVOFLOXACIN 500 MG PO TABS: 500.0000 mg | ORAL_TABLET | Freq: Every day | ORAL | Status: DC

## 2015-06-11 MED ORDER — PROBIOTIC PO CAPS: 1.0000 | ORAL_CAPSULE | Freq: Every day | ORAL | Status: DC

## 2015-06-11 NOTE — Care Management (Addendum)
List of private duty agencies shared with patient's dad. He is still undecided on LTC or HH. I have asked the hospital Chaplain to visit with father. Father declined home health and will seek PCS assistance on his own. PASRR pending for LTC. Father has not further RNCM needs.   Patient[facility] although I have discussed at length with him my concern with patient swollowing. Katheryn with SLP also talked to father at length and he agreed when she was in there which is why I followed up with father. He would like to try [facility]. I have notified Barbara CowerJason with [facility] of this request.

## 2015-06-11 NOTE — Discharge Instructions (Signed)
On dysphagia diet with honey thick liquids Aspiration precautions Give yogurt with probiotis while on the antibiotics

## 2015-06-11 NOTE — Discharge Summary (Signed)
[facility]   PATIENT NAME: Carlos [name]    MR#:  [id number]  DATE OF BIRTH:  1955/08/12  DATE OF ADMISSION:  06/09/2015 ADMITTING PHYSICIAN: Arnaldo NatalMichael S Diamond, MD  DATE OF DISCHARGE: 06/11/2015  PRIMARY CARE PHYSICIAN: No primary care provider on file.    ADMISSION DIAGNOSIS:  Sepsis secondary to UTI (HCC) [A41.9, N39.0]  DISCHARGE DIAGNOSIS:  Active Problems:   UTI (lower urinary tract infection)   SECONDARY DIAGNOSIS:   Past Medical History  Diagnosis Date  . Ulcerative colitis (HCC)   . Down syndrome     more likley CP    HOSPITAL COURSE:   59 year old male with the past medical history significant for Down syndrome, ulcerative colitis, bedbound and nonverbal at baseline presents to the hospital secondary to altered mental status.  #1 Acute metabolic encephalopathy-secondary to infection, likely source urine. -Chest x-ray with no acute infiltrate noted. -Negative blood and urine cultures. -Mental status is much better. Antibiotics changed over to oral Levaquin at discharge. Probiotics added to prevent diarrhea -No further fevers here  #2 hypertension-continue outpatient medications On ARB and Aldactone. -Blood pressure borderline elevated  #3 ulcerative colitis history-on Pentasa and also mesalamine enemas when necessary  #4 history of aspiration pneumonia-seen by his speech therapist. - on dysphagia diet and honey thick liquids  Home health at discharge  DISCHARGE CONDITIONS:   Guarded with poor long term prognosis  CONSULTS OBTAINED:   None  DRUG ALLERGIES:  No Known Allergies  DISCHARGE MEDICATIONS:   Current Discharge Medication List    START taking these medications   Details  levofloxacin (LEVAQUIN) 500 MG tablet Take 1 tablet (500 mg total) by mouth daily. Qty: 5 tablet, Refills: 0    Probiotic CAPS Take 1 capsule by mouth daily. Qty: 5 capsule, Refills: 0      CONTINUE these  medications which have NOT CHANGED   Details  mesalamine (PENTASA) 500 MG CR capsule Take 2,000 mg by mouth 2 (two) times daily.    mesalamine (ROWASA) 4 G enema Place 4 g rectally at bedtime as needed (Rectal bleeding).     spironolactone (ALDACTONE) 100 MG tablet Take 100 mg by mouth daily. Patient [facility]    valsartan (DIOVAN) 80 MG tablet Take 80-160 mg by mouth 2 (two) times daily. 160mg  in the morning and 80 in the evening         DISCHARGE INSTRUCTIONS:   1. PCP f/u in 1 week 2. Home health  If you experience worsening of your admission symptoms, develop shortness of breath, life threatening emergency, suicidal or homicidal thoughts you must seek medical attention immediately by calling 911 or calling your MD immediately  if symptoms less severe.  You Must read complete instructions/literature along with all the possible adverse reactions/side effects for all the Medicines you take and that have been prescribed to you. Take any new Medicines after you have completely understood and accept all the possible adverse reactions/side effects.   Please note  You were cared for by a hospitalist during your hospital stay. If you have any questions about your discharge medications or the care you received while you were in the hospital after you are discharged, you can call the unit and asked to speak with the hospitalist on call if the hospitalist that took care of you is not available. Once you are discharged, your primary care physician will handle any further medical issues. Please note  that NO REFILLS for any discharge medications will be authorized once you are discharged, as it is imperative that you return to your primary care physician (or establish a relationship with a primary care physician if you do not have one) for your aftercare needs so that they can reassess your need for medications and monitor your [name]  values.    Today   CHIEF COMPLAINT:   Chief Complaint  Patient presents with  . Hypertension    father states pt had a fever, rapid heart rate and high blood pressure at home prior to arrival, was seen at the [facility] clinic with a low grade fever dx aspiration Pnuemonia and placed on ABX father states believes the same thing has happened     VITAL SIGNS:  Blood pressure 166/92, pulse 94, temperature 98.5 F (36.9 C), temperature source Oral, resp. rate 16, height  (1.473 m), weight 57.834 kg (127 lb 8 oz), SpO2 92 %.  I/O:   Intake/Output Summary (Last 24 hours) at 06/11/15 0937 Last data filed at 06/11/15 0400  Gross per 24 hour  Intake   2720 ml  Output      0 ml  Net   2720 ml    PHYSICAL EXAMINATION:   Physical Exam   GENERAL: 59 y.o.-year-old patient lying in the bed with no acute distress. Has Down syndrome EYES: Pupils equal, round, reactive to light and accommodation. No scleral icterus. Extraocular muscles intact.  HEENT: Head atraumatic, normocephalic. Oropharynx and nasopharynx clear.  NECK: Supple, no jugular venous distention. No thyroid enlargement, no tenderness.  LUNGS: Normal breath sounds bilaterally, no wheezing, rales,rhonchi or crepitation. Decreased bibasilar breath sounds No use of accessory muscles of respiration.  CARDIOVASCULAR: S1, S2 normal. No murmurs, rubs, or gallops.  ABDOMEN: Soft, nontender, nondistended. Bowel sounds present. No organomegaly or mass.  EXTREMITIES: No pedal edema, cyanosis, or clubbing.  NEUROLOGIC: Bedbound at baseline. Able to move both upper extremities. No new focal motor or neuro deficits, non verbal PSYCHIATRIC: The patient is alert but not oriented.  SKIN: No obvious rash, lesion, or ulcer.   DATA REVIEW:   CBC  Recent Labs [name] 06/10/15 0453  WBC 15.5*  HGB 14.0  HCT 41.7  PLT 131*    Chemistries   Recent Labs [name] 06/09/15 2025 06/10/15 0453  NA 140  --   K 3.9  --   CL 103   --   CO2 29  --   GLUCOSE 130*  --   BUN 12  --   CREATININE 0.68 0.69  CALCIUM 8.9  --     Cardiac Enzymes  Recent Labs [name] 06/09/15 2025  TROPONINI 0.03    Microbiology Results  Results for orders placed or performed during the hospital encounter of 06/09/15  Blood culture (routine x 2)     Status: None (Preliminary result)   Collection Time: 06/09/15  8:25 PM  Result Value Ref Range Status   Specimen Description BLOOD RIGHT ANTECUBITAL  Final   Special Requests BOTTLES DRAWN AEROBIC AND ANAEROBIC  Final   Culture NO GROWTH 2 DAYS  Final   Report Status PENDING  Incomplete  Blood culture (routine x 2)     Status: None (Preliminary result)   Collection Time: 06/09/15 11:49 PM  Result Value Ref Range Status   Specimen Description BLOOD LEFT HAND  Final   Special Requests BOTTLES DRAWN AEROBIC AND ANAEROBIC  Final   Culture NO GROWTH 1 DAY  Final   Report  Status PENDING  Incomplete  Urine culture     Status: None (Preliminary result)   Collection Time: 06/09/15 11:50 PM  Result Value Ref Range Status   Specimen Description URINE, RANDOM  Final   Special Requests NONE  Final   Culture NO GROWTH < 12 HOURS  Final   Report Status PENDING  Incomplete    RADIOLOGY:  Dg Chest 2 View  06/09/2015  CLINICAL DATA:  Fevers EXAM: CHEST - 2 VIEW COMPARISON:  None. FINDINGS: Cardiac shadow is within normal limits. Lungs are well aerated bilaterally. No focal infiltrate or sizable effusion is seen. No bony abnormality is noted. IMPRESSION: No acute abnormality seen. Electronically Signed   By: Alcide Clever M.D.   On: 06/09/2015 21:26    EKG:   Orders placed or performed during the hospital encounter of 06/09/15  . ED EKG within 10 minutes  . ED EKG within 10 minutes  . EKG 12-Lead  . EKG 12-Lead      Management plans discussed with the patient, family and they are in agreement.  CODE STATUS:     Code Status Orders        Start     Ordered   06/10/15 1610   Full code   Continuous     06/10/15 0211      TOTAL TIME TAKING CARE OF THIS PATIENT: 37 minutes.    Enid Baas M.D on 06/11/2015 at 9:37 AM  Between 7am to 6pm - Pager - [phone]  After 6pm go to [email or web address] - password [email or web address] [facility]  [facility] [place] [facility]  Office  [phone]  CC: Primary care physician; No primary care provider on file.

## 2015-06-11 NOTE — Progress Notes (Signed)
Speech Language Pathology Treatment: Dysphagia  Patient Details Name: Raydel Hosick MRN: [id number] DOB: September 01, 1955 Today's Date: 06/11/2015 Time: 3818-4037 SLP Time Calculation (min) (ACUTE ONLY): 60 min  Assessment / Plan / Recommendation Clinical Impression  Pt appears to be tolerating his currently ordered Dysphagia diet of Dys. 1 w/ Honey consistency liquids w/ no overt s/s of aspiration per Father or staff. Pt requires feeding w/ verbal/tactile cues to direct attention to task and follow through w/ accepting boluses. Given min. Extra time for oral phase management, A-P transfer, and swallow/clearing, pt appeared to tolerate bolus trials fed to him. Met w/ Father during session for lengthy discussion and education aspiration precautions, diet consistency and food prep, and feeding strategies - handouts on precautions; ordering information; food prep given. Discussed preparation of pureed foods using blender at home; suggestion for easy foods. Father stated he had been starting to puree foods at home but did not have extensive knowledge and was thankful for the information. Strongly suggested to him that f/u w/ ST services for continued education on the diet prep was available via [place]. Father was agreeable to the education services. CM informed and will arrange.  Thickened liquids and thickener sent home as well.    HPI HPI: Pt presented to the emergency department with his father who is his primary caregiver due to his cerebral palsy and down syndrome, per chart notes. His father was concerned the patient was more lethargic today than usual and not acting himself. His father reports low-grade fever of more than 100F. He also states the patient has not been eating well but denies vomiting or diarrhea. In the emergency department the patient was found to have urinary tract infection as well as uncontrolled hypertension. Pt's diet has been modified to a Honey consistency liquids and puree diet per  Father, his caregiver, who described pt having dysphagia at home w/ his diet in the past year. Pt is currently tolerating the Dys. I w/ honey consistency liquids w/out overt s/s of aspiration pre NSG report; Father's report. Pt does require feeding asssitance d/t Cognitive status; cues to redirect to task.       SLP Plan  Continue with current plan of care     Recommendations  Diet recommendations: Dysphagia 1 (puree);Honey-thick liquid (objective assessment in future for upgrade to Nectar(?)) Liquids provided via: Teaspoon;Cup Medication Administration: Crushed with puree (as able) Supervision: Trained caregiver to feed patient;Full supervision/cueing for compensatory strategies Compensations: Minimize environmental distractions;Slow rate;Small sips/bites;Follow solids with liquid Postural Changes and/or Swallow Maneuvers: Seated upright 90 degrees              General recommendations: Rehab consult ([facility] f/u for education w/ Father for diet consistency prep) Oral Care Recommendations: Oral care BID;Staff/trained caregiver to provide oral care Follow up Recommendations: Home health SLP (education w/ Father on diet prep; ordering info.) Plan: Continue with current plan of care    Orinda Kenner, [place], CCC-SLP  Leimomi Zervas 06/11/2015, 2:46 PM

## 2015-06-11 NOTE — Progress Notes (Signed)
Patient's father [name] has decided to take patient home today and declined home health. Per father [facility] is received. Per [name] she will follow up on patient's PASARR and notify father when [name] is received. Per father he can transport patient in a car. RN and RN Case Manager are aware of above. Please reconsult if future social work needs arise. CSW signing off.   Jetta LoutBailey Morgan, LCSWA [phone]

## 2015-06-11 NOTE — Progress Notes (Signed)
Per [name] admissions coordinator at [facility] they can accept patient from the hospital with a PASARR and if family can pay for 24 days up front which is around $5,500. Per [name] Medicaid will pick up at the start of the next month. [name] also explained that if patient wants to wait and call [name] towards the end of December then family will have fewer days to pay privately, however [name] may not have a male bed available at that time.   Clinical Social Worker (CSW) met with patient's father Carlos [name] and presented the above information. Father reported that he could pay privately but he wanted to think about putting his son at [name] or taking him home with home health. CSW will follow up with father this afternoon.   PASARR is pending.   [name], [place] [phone]

## 2015-06-11 NOTE — Progress Notes (Signed)
   06/11/15 1055  Clinical Encounter Type  Visited With Family  Visit Type Initial  Referral From Care management  Consult/Referral To Chaplain  Stress Factors  Patient Stress Factors None identified  Chaplain rounded in unit and offered pastoral care to patient's father.   Chaplain Stayce Delancy [phone]:[phone]

## 2015-06-11 NOTE — Progress Notes (Addendum)
Pt discharging this shift. Father present, will take pt home and f/u with HH. Extensive education provided by nursing, Dietary, and Speech for at home care.  Pt to transport by car with family.

## 2015-06-12 LAB — URINE CULTURE: Culture: >=100000

## 2015-06-12 NOTE — Progress Notes (Signed)
Clinical Child psychotherapistocial Worker (CSW) received call from Carlos AduMichael Price from [facility] stating that patient does not meet criteria for a SNF level PASARR. Per Carlos NeedleMichael patient will meet criteria for an intermediate level of care like an ALF. CSW explained to Carlos NeedleMichael that patient is bed bound and is total care. Per Carlos NeedleMichael "some ALF's can provide total care." Carlos NeedleMichael stated that patient's with an IDD diagnosis need to have a skilled need for SNF like wound care, dialysis, PT or OT. CSW made Carlos NeedleMichael aware that patient has now discharged home. CSW contacted patient's father Carlos Price and made him aware of above. Father stated that he was [name]. Father stated that he was disappointed. CSW explained that [facility] believes patient is more appropriate for an "intermediate level of care." CSW explained to father that patient's primary care physician and the home health agency can assist him with placement if that is what he wants to pursue. CSW also [name] and made her aware of above.   Carlos LoutBailey Price, LCSWA [phone]

## 2015-06-14 LAB — CULTURE, BLOOD (ROUTINE X 2): CULTURE: NO GROWTH

## 2015-06-15 LAB — CULTURE, BLOOD (ROUTINE X 2): Culture: NO GROWTH

## 2016-01-07 ENCOUNTER — Inpatient Hospital Stay
Admission: EM | Admit: 2016-01-07 | Discharge: 2016-01-10 | DRG: 872 | Disposition: A | Discharge status: Home / Self Care | Payer: Medicare Other | Attending: Internal Medicine | Admitting: Internal Medicine

## 2016-01-07 ENCOUNTER — Emergency Department: Payer: Medicare Other

## 2016-01-07 ENCOUNTER — Encounter: Payer: Self-pay | Admitting: Emergency Medicine

## 2016-01-07 DIAGNOSIS — Z66 Do not resuscitate: Secondary | ICD-10-CM | POA: Diagnosis present

## 2016-01-07 DIAGNOSIS — Q909 Down syndrome, unspecified: Secondary | ICD-10-CM | POA: Diagnosis not present

## 2016-01-07 DIAGNOSIS — F79 Unspecified intellectual disabilities: Secondary | ICD-10-CM | POA: Diagnosis present

## 2016-01-07 DIAGNOSIS — Z79899 Other long term (current) drug therapy: Secondary | ICD-10-CM

## 2016-01-07 DIAGNOSIS — Z993 Dependence on wheelchair: Secondary | ICD-10-CM

## 2016-01-07 DIAGNOSIS — Z8701 Personal history of pneumonia (recurrent): Secondary | ICD-10-CM | POA: Diagnosis not present

## 2016-01-07 DIAGNOSIS — R6520 Severe sepsis without septic shock: Secondary | ICD-10-CM | POA: Diagnosis present

## 2016-01-07 DIAGNOSIS — N390 Urinary tract infection, site not specified: Secondary | ICD-10-CM | POA: Diagnosis present

## 2016-01-07 DIAGNOSIS — Z792 Long term (current) use of antibiotics: Secondary | ICD-10-CM

## 2016-01-07 DIAGNOSIS — A419 Sepsis, unspecified organism: Secondary | ICD-10-CM

## 2016-01-07 DIAGNOSIS — A4151 Sepsis due to Escherichia coli [E. coli]: Principal | ICD-10-CM | POA: Diagnosis present

## 2016-01-07 DIAGNOSIS — K5190 Ulcerative colitis, unspecified, without complications: Secondary | ICD-10-CM | POA: Diagnosis present

## 2016-01-07 DIAGNOSIS — A4189 Other specified sepsis: Secondary | ICD-10-CM | POA: Diagnosis present

## 2016-01-07 DIAGNOSIS — R531 Weakness: Secondary | ICD-10-CM | POA: Diagnosis present

## 2016-01-07 DIAGNOSIS — N3000 Acute cystitis without hematuria: Secondary | ICD-10-CM | POA: Diagnosis present

## 2016-01-07 DIAGNOSIS — Z7401 Bed confinement status: Secondary | ICD-10-CM

## 2016-01-07 DIAGNOSIS — R1310 Dysphagia, unspecified: Secondary | ICD-10-CM

## 2016-01-07 DIAGNOSIS — E876 Hypokalemia: Secondary | ICD-10-CM | POA: Diagnosis present

## 2016-01-07 HISTORY — DX: Essential (primary) hypertension: I10

## 2016-01-07 LAB — COMPREHENSIVE METABOLIC PANEL
ALBUMIN: 4 g/dL (ref 3.5–5.0)
ALT: 12 U/L — ABNORMAL LOW (ref 17–63)
ANION GAP: 8 (ref 5–15)
AST: 19 U/L (ref 15–41)
Alkaline Phosphatase: 78 U/L (ref 38–126)
BUN: 10 mg/dL (ref 6–20)
CHLORIDE: 100 mmol/L — AB (ref 101–111)
CO2: 31 mmol/L (ref 22–32)
Calcium: 8.6 mg/dL — ABNORMAL LOW (ref 8.9–10.3)
Creatinine, Ser: 0.72 mg/dL (ref 0.61–1.24)
GFR calc Af Amer: >60 mL/min (ref >60)
GFR calc non Af Amer: >60 mL/min (ref >60)
GLUCOSE: 132 mg/dL — AB (ref 65–99)
POTASSIUM: 3.2 mmol/L — AB (ref 3.5–5.1)
SODIUM: 139 mmol/L (ref 135–145)
TOTAL PROTEIN: 7.1 g/dL (ref 6.5–8.1)
Total Bilirubin: 1.2 mg/dL (ref 0.3–1.2)

## 2016-01-07 LAB — CBC WITH DIFFERENTIAL/PLATELET
BASOS ABS: 0 10*3/uL (ref 0–0.1)
Basophils Relative: 0 %
EOS PCT: 0 %
Eosinophils Absolute: 0 10*3/uL (ref 0–0.7)
HCT: 44.8 % (ref 40.0–52.0)
HEMOGLOBIN: 15.8 g/dL (ref 13.0–18.0)
LYMPHS ABS: 0.7 10*3/uL — AB (ref 1.0–3.6)
Lymphocytes Relative: 7 %
MCH: 31.2 pg (ref 26.0–34.0)
MCHC: 35.3 g/dL (ref 32.0–36.0)
MCV: 88.5 fL (ref 80.0–100.0)
Monocytes Absolute: 0.5 10*3/uL (ref 0.2–1.0)
Monocytes Relative: 5 %
NEUTROS ABS: 8.3 10*3/uL — AB (ref 1.4–6.5)
NEUTROS PCT: 88 %
PLATELETS: 123 10*3/uL — AB (ref 150–440)
RBC: 5.06 MIL/uL (ref 4.40–5.90)
RDW: 13.9 % (ref 11.5–14.5)
WBC: 9.6 10*3/uL (ref 3.8–10.6)

## 2016-01-07 LAB — URINALYSIS COMPLETE WITH MICROSCOPIC (ARMC ONLY)
BILIRUBIN URINE: NEGATIVE
Glucose, UA: NEGATIVE mg/dL
HGB URINE DIPSTICK: NEGATIVE
KETONES UR: NEGATIVE mg/dL
Nitrite: POSITIVE — AB
PH: 8 (ref 5.0–8.0)
PROTEIN: NEGATIVE mg/dL
Specific Gravity, Urine: 1.01 (ref 1.005–1.030)

## 2016-01-07 LAB — TROPONIN I: Troponin I: <0.03 ng/mL (ref <0.03)

## 2016-01-07 LAB — LACTIC ACID, PLASMA: LACTIC ACID, VENOUS: 1.4 mmol/L (ref 0.5–1.9)

## 2016-01-07 LAB — APTT: aPTT: 29 seconds (ref 24–36)

## 2016-01-07 LAB — LIPASE, BLOOD: Lipase: 24 U/L (ref 11–51)

## 2016-01-07 LAB — PROTIME-INR
INR: 0.97
Prothrombin Time: 13.1 seconds (ref 11.4–15.0)

## 2016-01-07 LAB — GLUCOSE, CAPILLARY: Glucose-Capillary: 144 mg/dL — ABNORMAL HIGH (ref 65–99)

## 2016-01-07 MED ORDER — ACETAMINOPHEN 650 MG RE SUPP
650.0000 mg | Freq: Once | RECTAL | Status: AC
  Administered 2016-01-07: 650 mg via RECTAL

## 2016-01-07 MED ORDER — PIPERACILLIN-TAZOBACTAM 3.375 G IVPB
3.3750 g | Freq: Three times a day (TID) | INTRAVENOUS | Status: DC
  Administered 2016-01-07 – 2016-01-08 (×2): 3.375 g via INTRAVENOUS
  Filled 2016-01-07 (×4): qty 50

## 2016-01-07 MED ORDER — ENOXAPARIN SODIUM 40 MG/0.4ML ~~LOC~~ SOLN
40.0000 mg | SUBCUTANEOUS | Status: DC
  Administered 2016-01-08: 40 mg via SUBCUTANEOUS
  Filled 2016-01-07: qty 0.4

## 2016-01-07 MED ORDER — SODIUM CHLORIDE 0.9 % IV BOLUS (SEPSIS)
250.0000 mL | Freq: Once | INTRAVENOUS | Status: AC
  Administered 2016-01-07: 250 mL via INTRAVENOUS

## 2016-01-07 MED ORDER — VANCOMYCIN HCL IN DEXTROSE 1-5 GM/200ML-% IV SOLN
1000.0000 mg | Freq: Once | INTRAVENOUS | Status: AC
  Administered 2016-01-07: 1000 mg via INTRAVENOUS
  Filled 2016-01-07: qty 200

## 2016-01-07 MED ORDER — DEXTROSE 5 % IV SOLN: 1.0000 g | Freq: Once | INTRAVENOUS | Status: DC

## 2016-01-07 MED ORDER — DEXTROSE 5 % IV SOLN: 500.0000 mg | Freq: Once | INTRAVENOUS | Status: DC

## 2016-01-07 MED ORDER — ACETAMINOPHEN 325 MG RE SUPP
RECTAL | Status: AC
  Filled 2016-01-07: qty 2

## 2016-01-07 MED ORDER — ACETAMINOPHEN 650 MG RE SUPP
650.0000 mg | Freq: Once | RECTAL | Status: AC
  Administered 2016-01-07: 650 mg via RECTAL
  Filled 2016-01-07: qty 1

## 2016-01-07 MED ORDER — ACETAMINOPHEN 650 MG RE SUPP
650.0000 mg | Freq: Four times a day (QID) | RECTAL | Status: DC | PRN
  Administered 2016-01-07: 650 mg via RECTAL
  Filled 2016-01-07: qty 1

## 2016-01-07 MED ORDER — ACETAMINOPHEN 325 MG PO TABS
650.0000 mg | ORAL_TABLET | Freq: Four times a day (QID) | ORAL | Status: DC | PRN
  Administered 2016-01-08: 650 mg via ORAL

## 2016-01-07 MED ORDER — SODIUM CHLORIDE 0.9 % IV BOLUS (SEPSIS)
1000.0000 mL | Freq: Once | INTRAVENOUS | Status: AC
  Administered 2016-01-07: 1000 mL via INTRAVENOUS

## 2016-01-07 MED ORDER — SODIUM CHLORIDE 0.9% FLUSH
3.0000 mL | Freq: Two times a day (BID) | INTRAVENOUS | Status: DC
  Administered 2016-01-07 – 2016-01-10 (×5): 3 mL via INTRAVENOUS

## 2016-01-07 MED ORDER — METOPROLOL TARTRATE 5 MG/5ML IV SOLN: 5.0000 mg | Freq: Once | INTRAVENOUS | Status: DC

## 2016-01-07 MED ORDER — POTASSIUM CHLORIDE IN NACL 20-0.9 MEQ/L-% IV SOLN
INTRAVENOUS | Status: DC
  Administered 2016-01-07 – 2016-01-09 (×3): via INTRAVENOUS
  Filled 2016-01-07 (×4): qty 1000

## 2016-01-07 MED ORDER — SODIUM CHLORIDE 0.9 % IV BOLUS (SEPSIS)
500.0000 mL | Freq: Once | INTRAVENOUS | Status: AC
Start: 2016-01-07 — End: 2016-01-07
  Administered 2016-01-07: 500 mL via INTRAVENOUS

## 2016-01-07 MED ORDER — ONDANSETRON HCL 4 MG/2ML IJ SOLN: 4.0000 mg | Freq: Four times a day (QID) | INTRAMUSCULAR | Status: DC | PRN

## 2016-01-07 MED ORDER — ONDANSETRON HCL 4 MG PO TABS: 4.0000 mg | ORAL_TABLET | Freq: Four times a day (QID) | ORAL | Status: DC | PRN

## 2016-01-07 MED ORDER — PIPERACILLIN-TAZOBACTAM 3.375 G IVPB 30 MIN
3.3750 g | Freq: Once | INTRAVENOUS | Status: AC
  Administered 2016-01-07: 3.375 g via INTRAVENOUS
  Filled 2016-01-07: qty 50

## 2016-01-07 MED ORDER — VANCOMYCIN HCL 500 MG IV SOLR
500.0000 mg | Freq: Two times a day (BID) | INTRAVENOUS | Status: DC
  Administered 2016-01-08: 500 mg via INTRAVENOUS
  Filled 2016-01-07 (×2): qty 500

## 2016-01-07 MED ORDER — ACETAMINOPHEN 325 MG PO TABS: 650.0000 mg | ORAL_TABLET | Freq: Once | ORAL | Status: DC

## 2016-01-07 MED ORDER — ONDANSETRON HCL 4 MG/2ML IJ SOLN
INTRAMUSCULAR | Status: AC
  Administered 2016-01-07: 4 mg
  Filled 2016-01-07: qty 2

## 2016-01-07 MED ORDER — METOPROLOL TARTRATE 5 MG/5ML IV SOLN
5.0000 mg | INTRAVENOUS | Status: DC | PRN
  Administered 2016-01-10: 5 mg via INTRAVENOUS
  Filled 2016-01-07: qty 5

## 2016-01-07 MED ORDER — DIPHENHYDRAMINE HCL 50 MG/ML IJ SOLN
25.0000 mg | Freq: Once | INTRAMUSCULAR | Status: AC
  Administered 2016-01-07: 25 mg via INTRAVENOUS
  Filled 2016-01-07: qty 1

## 2016-01-07 NOTE — ED Notes (Signed)
Patient presents to ED via POV with father due to altered mental status. Patient is wheelchair bound and only able to answer yes and no questions at baseline. Father and caregiver state he has been slummed over since 1300 and has been more altered than normal. Patient was recently seen for UTI.

## 2016-01-07 NOTE — Progress Notes (Signed)
Pharmacy Antibiotic Note  Jarvis NewcomerJimmie Linde [name] is a 60 y.o. male admitted on 01/07/2016 with sepsis.  Pharmacy has been consulted for vancomycin and piperacillin/tazobactam dosing.  Plan: Piperacillin/tazobactam 3.375 g IV q8h EI  Vancomycin 1000 mg dose given in ED Will order vancomycin 500 mg IV q12h (stacked dosing to begin 10 hours after initial dose) Vancomycin trough goal 15-20 mcg/mL Vancomycin trough ordered on 7/7 @ 1530  Height: 4\' 9"  (144.8 cm) Weight: 113 lb (51.256 kg) IBW/kg (Calculated) : 43.1  Temp (24hrs), Avg:102.5 F (39.2 C), Min:99.6 F (37.6 C), Max:104.9 F (40.5 C)   Recent Labs Lab 01/07/16 1800  WBC 9.6  CREATININE 0.72  LATICACIDVEN 2.0*    Estimated Creatinine Clearance: 61.4 mL/min (by C-G formula based on Cr of 0.72).    No Known Allergies  Antimicrobials this admission: vancomycin 7/5 >>  Piperacillin/tazobactam 7/5 >>   Dose adjustments this admission:  Microbiology results: 7/5 BCx: Sent 7/5 UCx: Sent   Thank you for allowing pharmacy to be a part of this patient's care.  Cindi CarbonMary M Roe Wilner, PharmD Clinical Pharmacist 01/07/2016 9:47 PM

## 2016-01-07 NOTE — H&P (Addendum)
PCP:   Orene DesanctisBEHLING,KAREN, MD   Chief Complaint:  Increased somnolence  HPI: this is 60 y/o male born wityh congential weakness, limited physical and mental abilities (no clear diagnosis). He is now bed bound and at baseline  Not conversant. He has dysphagia, on a pureed diet and honey thickened liquids. This AM he was ok per dad. By 1 PM he started looking sluggish. He developed a temperature of 101. He became tachycardic and lethargic. He had an elevated temperature. His dad called his PCP who directed him to the ER. In the Er the patient's temperature was 100.4, quickly increased to 104. He became nausea, vomiting, tachycardic up to 130, hypertensive(systolic BP up to 202).  The hospitalist have been asked to admit for sepsis d/t UTI. History provided by patients father. [phone] (H), [phone] (c)   Review of Systems:  Unable to assess d/t patients somnolence  Past Medical History: Past Medical History  Diagnosis Date  . Ulcerative colitis (HCC)   . Down syndrome     more likley CP   Past Surgical History  Procedure Laterality Date  . None      Medications: Prior to Admission medications   Medication Sig Start Date End Date Taking? Authorizing Provider  mesalamine (CANASA) 1000 MG suppository Place 1,000 mg rectally daily as needed.   Yes Historical Provider, MD  mesalamine (PENTASA) 500 MG CR capsule Take 2,000 mg by mouth 2 (two) times daily.   Yes Historical Provider, MD  spironolactone (ALDACTONE) 100 MG tablet Take 100 mg by mouth daily. Patient [facility]   Yes Historical Provider, MD  valsartan (DIOVAN) 80 MG tablet Take 80-160 mg by mouth 2 (two) times daily. 160mg  in the morning and 80 in the evening   Yes Historical Provider, MD    Allergies:  No Known Allergies  Social History:  reports that he has never smoked. He does not have any smokeless tobacco history on file. He reports that he does not drink alcohol. His  drug history is not on file. Patient is wheelchair bound. Has dysphagia. Lives at home with his father  Family History: Family History  Problem Relation Age of Onset  . Muscular dystrophy Mother     Physical Exam: Filed Vitals:   01/07/16 1943 01/07/16 1950 01/07/16 2000 01/07/16 2015  BP:   127/60   Pulse:   128   Temp: 104.1 F (40.1 C) 104.9 F (40.5 C)  103.1 F (39.5 C)  TempSrc: Axillary Rectal  Rectal  Resp:   31   Height:      Weight:      SpO2:   95%     General: Somnolent Eyes: PERRLA, pink conjunctiva, no scleral icterus ENT: Moist oral mucosa, neck supple, no thyromegaly Lungs: clear to ascultation, no wheeze, no crackles, no use of accessory muscles Cardiovascular: regular rate and rhythm, no regurgitation, no gallops, no murmurs. No carotid bruits, no JVD Abdomen: soft, positive BS, non-tender, non-distended, no organomegaly, not an acute abdomen GU: not examined Neuro: unable to assess Musculoskeletal: strength 0/5, unable to properly assess d/t patients somnolence, short limbs, poor muscle tone (chronic) Skin: no rash, no subcutaneous crepitation, no decubitus Psych: somnolent patient   Labs on Admission:   Recent Labs  01/07/16 1800  NA 139  K 3.2*  CL 100*  CO2 31  GLUCOSE 132*  BUN 10  CREATININE 0.72  CALCIUM 8.6*    Recent Labs  01/07/16 1800  AST 19  ALT 12*  ALKPHOS 78  BILITOT 1.2  PROT 7.1  ALBUMIN 4.0    Recent Labs  01/07/16 1800  LIPASE 24    Recent Labs  01/07/16 1800  WBC 9.6  NEUTROABS 8.3*  HGB 15.8  HCT 44.8  MCV 88.5  PLT 123*    Recent Labs  01/07/16 1800  TROPONINI <0.03   Invalid input(s): POCBNP No results for input(s): DDIMER in the last 72 hours. No results for input(s): HGBA1C in the last 72 hours. No results for input(s): CHOL, HDL, LDLCALC, TRIG, CHOLHDL, LDLDIRECT in the last 72 hours. No results for input(s): TSH, T4TOTAL, T3FREE, THYROIDAB in the last 72 hours.  Invalid input(s):  FREET3 No results for input(s): VITAMINB12, FOLATE, FERRITIN, TIBC, IRON, RETICCTPCT in the last 72 hours.  Micro Results: No results found for this or any previous visit (from the past 240 hour(s)). Results for Carlos Price, [place] (MRN [id number]) as of 01/07/2016 20:17  Ref. Range 01/07/2016 17:56  Appearance Latest Ref Range: CLEAR  HAZY (A)  Bacteria, UA Latest Ref Range: NONE SEEN  FEW (A)  Bilirubin Urine Latest Ref Range: NEGATIVE  NEGATIVE  Color, Urine Latest Ref Range: YELLOW  YELLOW (A)  Glucose Latest Ref Range: NEGATIVE mg/dL NEGATIVE  Hgb urine dipstick Latest Ref Range: NEGATIVE  NEGATIVE  Ketones, ur Latest Ref Range: NEGATIVE mg/dL NEGATIVE  Leukocytes, UA Latest Ref Range: NEGATIVE  3+ (A)  Mucous Unknown PRESENT  Nitrite Latest Ref Range: NEGATIVE  POSITIVE (A)  pH Latest Ref Range: 5.0-8.0  8.0  Protein Latest Ref Range: NEGATIVE mg/dL NEGATIVE  RBC / HPF Latest Ref Range: 0-5 RBC/hpf 0-5  Specific Gravity, Urine Latest Ref Range: 1.005-1.030  1.010  Squamous Epithelial / LPF Latest Ref Range: NONE SEEN  0-5 (A)  WBC, UA Latest Ref Range: 0-5 WBC/hpf TOO NUMEROUS TO C...    Radiological Exams on Admission: Dg Chest Port 1 View  01/07/2016  CLINICAL DATA:  Altered mental status. EXAM: PORTABLE CHEST 1 VIEW COMPARISON:  06/09/2015 FINDINGS: 1818 hours. Lungs are clear. Cardiopericardial silhouette is at upper limits of normal for size. The visualized bony structures of the thorax are intact with possible chronic posttraumatic deformity in the right humeral head. Telemetry leads overlie the chest. IMPRESSION: Stable.  No acute findings. Electronically Signed   By: Kennith CenterEric  Mansell M.D.   On: 01/07/2016 18:36    Assessment/Plan Present on Admission:  . UTI (lower urinary tract infection)/sepsis -admit to medsurg -cultures ordered -rocephin IV daily  Dysphagia -NPO for now. Once more alert start pureed diet and honey thickened liquid -aspiration  precaution  Hypokalemia -replete in IVF  Ulcerative Colitis -stable, on pentasa  Unspecified congenital neuromuscular disorder -chronic.    Akiem Urieta 01/07/2016, 8:17 PM

## 2016-01-07 NOTE — ED Notes (Signed)
Upon entering room. Patients face is bright red in color. Temp checked. New orders received. Dr. Scotty CourtStafford notified and at bedside.

## 2016-01-07 NOTE — ED Notes (Addendum)
Gave report. Will wait for recheck temp. If it is trending downward patient will be transported to floor.

## 2016-01-07 NOTE — Progress Notes (Signed)
Tele placed and verified x2

## 2016-01-07 NOTE — ED Notes (Signed)
Speaking to LaneJasmine, RN from 1A regarding patients status.

## 2016-01-07 NOTE — ED Notes (Signed)
Per family home temp was 101.5.

## 2016-01-07 NOTE — ED Notes (Signed)
Dr. Joneen Roachrosley at bedside speaking to patient and patients father.

## 2016-01-07 NOTE — ED Notes (Signed)
Dr. Stafford at bedside.  

## 2016-01-07 NOTE — ED Notes (Signed)
Patient vomiting, not spitting any vomit out at this time. MD made aware. Oral suction provided.

## 2016-01-07 NOTE — ED Notes (Signed)
Patients soiled brief changed at this time. BM noted. Soft and brown.

## 2016-01-07 NOTE — ED Provider Notes (Addendum)
[facility] Emergency Department Provider Note  ____________________________________________  Time seen: 5:50 PM  I have reviewed the triage vital signs and the nursing notes.   HISTORY  Chief Complaint Altered Mental Status  Level 5 caveat:  Portions of the history and physical were unable to be obtained due to the patient's acute illness and chronic intellectual disability. History obtained from father at bedside   HPI Carlos Price is a 60 y.o. male who is noted to have decreased energy level today, poor oral intake. Around 1 PM he seemed to be confused and unable to wake up and be fully alert to his surroundings. History of aspiration pneumonia. Down syndrome. Eats a pured diet. No changes in bowel habits or vomiting. No bloody diarrhea.     Past Medical History  Diagnosis Date  . Ulcerative colitis (HCC)   . Down syndrome     more likley CP  Father denies Down syndrome, states that there is not a firm diagnosis for his intellectual disability.   Patient Active Problem List   Diagnosis Date Noted  . UTI (lower urinary tract infection) 06/10/2015     Past Surgical History  Procedure Laterality Date  . None       Current Outpatient Rx  Name  Route  Sig  Dispense  Refill  . levofloxacin (LEVAQUIN) 500 MG tablet   Oral   Take 1 tablet (500 mg total) by mouth daily.   5 tablet   0     Crush and give with apple sauce   . mesalamine (PENTASA) 500 MG CR capsule   Oral   Take 2,000 mg by mouth 2 (two) times daily.         . mesalamine (ROWASA) 4 G enema   Rectal   Place 4 g rectally at bedtime as needed (Rectal bleeding).          . Probiotic CAPS   Oral   Take 1 capsule by mouth daily.   5 capsule   0     While on the antibiotics Can be substituted to an ...   . spironolactone (ALDACTONE) 100 MG tablet   Oral   Take 100 mg by mouth daily. Patient has tablet comp[facility] 5mL suspension [facility]         .  valsartan (DIOVAN) 80 MG tablet   Oral   Take 80-160 mg by mouth 2 (two) times daily. 160mg  in the morning and 80 in the evening            Allergies Review of patient's allergies indicates no known allergies.   Family History  Problem Relation Age of Onset  . Muscular dystrophy Mother     Social History Social History  Substance Use Topics  . Smoking status: Never Smoker   . Smokeless tobacco: None  . Alcohol Use: No    Review of Systems  Constitutional:   Fever to 101.5 at home  Cardiovascular:   No chest pain. Respiratory:   Positive nonproductive cough. Gastrointestinal:   Negative for abdominal pain, vomiting and diarrhea.  10-point ROS otherwise negative.  ____________________________________________   PHYSICAL EXAM:  VITAL SIGNS: ED Triage Vitals  Enc Vitals Group     BP 01/07/16 1753 178/149 mmHg     Pulse Rate 01/07/16 1753 110     Resp 01/07/16 1753 28     Temp 01/07/16 1753 99.6 F (37.6 C)     Temp Source 01/07/16 1753 Axillary  SpO2 01/07/16 1753 92 %     Weight 01/07/16 1750 124 lb (56.246 kg)     Height 01/07/16 1750 4\' 9"  (1.448 m)     Head Cir --      Peak Flow --      Pain Score --      Pain Loc --      Pain Edu? --      Excl. in GC? --     Vital signs reviewed, nursing assessments reviewed.   Constitutional:   Awake and alert, ill-appearing. Eyes:   No scleral icterus. No conjunctival pallor. PERRL. ENT   Head:   Normocephalic and atraumatic.   Nose:   No congestion/rhinnorhea. No septal hematoma   Mouth/Throat:   Unable to assess, patient not cooperative with exam.    Neck:   No stridor. No SubQ emphysema. No meningismus. Hematological/Lymphatic/Immunilogical:   No cervical lymphadenopathy. Cardiovascular:   Tachycardia heart rate 110, regular. Symmetric bilateral radial and DP pulses.  No murmurs.  Respiratory:   Normal respiratory effort without tachypnea nor retractions. Breath sounds equal bilaterally.  Bibasilar crackles Gastrointestinal:   Soft and nontender. Non distended. There is no CVA tenderness.  No rebound, rigidity, or guarding. Genitourinary:   deferred Musculoskeletal:   Nontender with normal range of motion in all extremities. No joint effusions.  No lower extremity tenderness.  No edema. Neurologic:   Nonverbal Cranial nerves grossly intact No focal motor weakness.  Skin:    Skin is warm, dry and intact. No rash noted.  No petechiae, purpura, or bullae.  ____________________________________________    LABS (pertinent positives/negatives) (all labs ordered are listed, but only abnormal results are displayed) Labs Reviewed  LACTIC ACID, PLASMA - Abnormal; Notable for the following:    Lactic Acid, Venous 2.0 (*)    All other components within normal limits  COMPREHENSIVE METABOLIC PANEL - Abnormal; Notable for the following:    Potassium 3.2 (*)    Chloride 100 (*)    Glucose, Bld 132 (*)    Calcium 8.6 (*)    ALT 12 (*)    All other components within normal limits  CBC WITH DIFFERENTIAL/PLATELET - Abnormal; Notable for the following:    Platelets 123 (*)    Neutro Abs 8.3 (*)    Lymphs Abs 0.7 (*)    All other components within normal limits  URINALYSIS COMPLETEWITH MICROSCOPIC ([facility] ONLY) - Abnormal; Notable for the following:    Color, Urine YELLOW (*)    APPearance HAZY (*)    Nitrite POSITIVE (*)    Leukocytes, UA 3+ (*)    Bacteria, UA FEW (*)    Squamous Epithelial / LPF 0-5 (*)    All other components within normal limits  GLUCOSE, CAPILLARY - Abnormal; Notable for the following:    Glucose-Capillary 144 (*)    All other components within normal limits  CULTURE, BLOOD (ROUTINE X 2)  CULTURE, BLOOD (ROUTINE X 2)  CULTURE, EXPECTORATED SPUTUM-ASSESSMENT  URINE CULTURE  LIPASE, BLOOD  TROPONIN I  APTT  PROTIME-INR  LACTIC ACID, PLASMA   ____________________________________________   EKG Interpreted by me Sinus tachycardia rate 109, normal  axis intervals QRS ST segments and T waves   ____________________________________________    RADIOLOGY  Chest x-ray unremarkable  ____________________________________________   PROCEDURES  CRITICAL CARE Performed by: Scotty CourtSTAFFORD, Abbee Cremeens   Total critical care time: 35 minutes  Critical care time was exclusive of separately billable procedures and treating other patients.  Critical care was necessary to treat  or prevent imminent or life-threatening deterioration.  Critical care was time spent personally by me on the following activities: development of treatment plan with patient and/or surrogate as well as nursing, discussions with consultants, evaluation of patient's response to treatment, examination of patient, obtaining history from patient or surrogate, ordering and performing treatments and interventions, ordering and review of laboratory studies, ordering and review of radiographic studies, pulse oximetry and re-evaluation of patient's condition.  ____________________________________________   INITIAL IMPRESSION / ASSESSMENT AND PLAN / ED COURSE  Pertinent labs & imaging results that were available during my care of the patient were reviewed by me and considered in my medical decision making (see chart for details).  Patient presents with fever or altered mental status and tachycardia. High suspicion for sepsis. Code sepsis initiated. IV fluids, vancomycin and Zosyn. Temperature here is 99.6 axillary which is consistent with fever noticed at home. Follow-up labs urinalysis chest x-ray. Low suspicion for stroke meningitis or encephalitis at this time.    ----------------------------------------- 6:52 PM on 01/07/2016 -----------------------------------------  Called to bedside by nurse due to patient trembling in face looking more red. Lungs clear to auscultation. There is redness diffusely over the skin can upper thorax. Skin is very hot to touch. Recheck of temperature  reveals is 100.4 axillary. No redness on the extremity where vancomycin is infusing. Vancomycin rate decreased by half. Tylenol suppository. IV Benadryl. Continue fluids and antibiotics. Tachycardia has increased to 140, likely related to increased fever. Continue following up labs. Has a urinary tract infection, chest x-ray clear. Lactate elevated.   ----------------------------------------- 7:30 PM on 01/07/2016 -----------------------------------------  Called back to bedside by nurse due to vomiting. Patient suctioned orally. Still good air entry in all lung fields, no respiratory distress. Redness of the head and neck are unchanged. There are 2 or 3 urticaria lesions on the right upper extremity where he received IV Benadryl. Antibiotic infusions have artery been completed. Tachycardia has improved to 120. Blood pressure remains elevated. Continue IV fluids, close monitoring in the ED. case discussed with hospitalist for admission   ____________________________________________   FINAL CLINICAL IMPRESSION(S) / ED DIAGNOSES  Final diagnoses:  Acute cystitis without hematuria  Sepsis, due to unspecified organism [facility])       Portions of this note were generated with dragon dictation software. Dictation errors may occur despite best attempts at proofreading.   Sharman Cheek, MD 01/07/16 1933  Sharman Cheek, MD 01/07/16 [phone]

## 2016-01-07 NOTE — Progress Notes (Signed)
Pt admitted to floor from ED. Cardiac monitoring ordered. No tele box on floor. Awaiting results

## 2016-01-07 NOTE — ED Notes (Signed)
Updated Jasmine, RN on patients temperature. Will transport patient to the floor at this time.

## 2016-01-07 NOTE — ED Notes (Signed)
Patient completely on left side. Patient is resting much more comfortably at this time. Family educated on call bell and to notified staff if patient begins vomiting again. Will continue to monitor. VSS.

## 2016-01-08 LAB — BASIC METABOLIC PANEL
Anion gap: 7 (ref 5–15)
BUN: 11 mg/dL (ref 6–20)
CHLORIDE: 110 mmol/L (ref 101–111)
CO2: 27 mmol/L (ref 22–32)
Calcium: 7.2 mg/dL — ABNORMAL LOW (ref 8.9–10.3)
Creatinine, Ser: 1.1 mg/dL (ref 0.61–1.24)
Glucose, Bld: 112 mg/dL — ABNORMAL HIGH (ref 65–99)
Potassium: 3.1 mmol/L — ABNORMAL LOW (ref 3.5–5.1)
Sodium: 144 mmol/L (ref 135–145)

## 2016-01-08 LAB — MAGNESIUM: MAGNESIUM: 1.8 mg/dL (ref 1.7–2.4)

## 2016-01-08 LAB — LACTIC ACID, PLASMA: LACTIC ACID, VENOUS: 2 mmol/L — AB (ref 0.5–1.9)

## 2016-01-08 LAB — CBC
HEMATOCRIT: 41.9 % (ref 40.0–52.0)
HEMOGLOBIN: 14.4 g/dL (ref 13.0–18.0)
MCH: 30.9 pg (ref 26.0–34.0)
MCHC: 34.4 g/dL (ref 32.0–36.0)
MCV: 89.8 fL (ref 80.0–100.0)
Platelets: 101 10*3/uL — ABNORMAL LOW (ref 150–440)
RBC: 4.67 MIL/uL (ref 4.40–5.90)
RDW: 14.2 % (ref 11.5–14.5)
WBC: 15.6 10*3/uL — AB (ref 3.8–10.6)

## 2016-01-08 MED ORDER — IRBESARTAN 75 MG PO TABS
37.5000 mg | ORAL_TABLET | Freq: Every day | ORAL | Status: DC
  Administered 2016-01-08 – 2016-01-09 (×2): 37.5 mg via ORAL
  Filled 2016-01-08: qty 1
  Filled 2016-01-08: qty 0.5

## 2016-01-08 MED ORDER — DEXTROSE 5 % IV SOLN
1.0000 g | INTRAVENOUS | Status: DC
  Administered 2016-01-08 – 2016-01-10 (×3): 1 g via INTRAVENOUS
  Filled 2016-01-08 (×3): qty 10

## 2016-01-08 MED ORDER — POTASSIUM CHLORIDE 20 MEQ PO PACK
40.0000 meq | PACK | Freq: Once | ORAL | Status: AC
  Administered 2016-01-08: 40 meq via ORAL
  Filled 2016-01-08: qty 2

## 2016-01-08 MED ORDER — SODIUM CHLORIDE 0.9 % IV BOLUS (SEPSIS)
500.0000 mL | Freq: Once | INTRAVENOUS | Status: AC
  Administered 2016-01-08: 500 mL via INTRAVENOUS

## 2016-01-08 MED ORDER — SPIRONOLACTONE 25 MG PO TABS
100.0000 mg | ORAL_TABLET | Freq: Every day | ORAL | Status: DC
  Administered 2016-01-08 – 2016-01-10 (×3): 100 mg via ORAL
  Filled 2016-01-08 (×3): qty 4

## 2016-01-08 MED ORDER — MESALAMINE ER 250 MG PO CPCR
2000.0000 mg | ORAL_CAPSULE | Freq: Two times a day (BID) | ORAL | Status: DC
  Administered 2016-01-08 – 2016-01-09 (×3): 2000 mg via ORAL
  Administered 2016-01-09: 1750 mg via ORAL
  Administered 2016-01-10: 2000 mg via ORAL
  Filled 2016-01-08 (×5): qty 8

## 2016-01-08 NOTE — Evaluation (Signed)
Clinical/Bedside Swallow Evaluation Patient Details  Name: Ramonita LabJimmie Lesinski MRN: [id number] Date of Birth: Apr 18, 1956  Today's Date: 01/08/2016 Time: SLP Start Time (ACUTE ONLY): 1200 SLP Stop Time (ACUTE ONLY): 1300 SLP Time Calculation (min) (ACUTE ONLY): 60 min  Past Medical History:  Past Medical History  Diagnosis Date  . Ulcerative colitis (HCC)   . Down syndrome     more likley CP   Past Surgical History:  Past Surgical History  Procedure Laterality Date  . None     HPI:  This is 60 y/o male born with congential weakness, limited physical and mental abilities (no clear diagnosis). He is now bed bound and at baseline Not conversant. He has dysphagia, on a pureed diet and honey thickened liquids. This AM he was ok per dad. By 1 PM he started looking sluggish. He developed a temperature of 101. He became tachycardic and lethargic. He had an elevated temperature. His dad called his PCP who directed him to the ER. In the Er the patient's temperature was 100.4, quickly increased to 104. He became nausea, vomiting, tachycardic up to 130, hypertensive(systolic BP up to 202). The hospitalist have been asked to admit for sepsis d/t UTI. History provided by patients father.   Assessment / Plan / Recommendation Clinical Impression  Pt with baseline diet of dysphagia 1 and honey thick liquids d/t moderate oropharyngeal dysphagia. Father present and feeding pt. Pt consumed functionally and without any clinical s/s of aspiration. Father states that pt has consumed functionally at home and appears at baseline this meal. Skilled ST not indicated as pt is at baseline and father is following general aspiration precautions. Father verbalized understanding and agreement.     Aspiration Risk  Moderate aspiration risk    Diet Recommendation Dysphagia 1 with honey thick liquids via spoon.    Medication Administration: Crushed with puree    Other  Recommendations Oral Care Recommendations: Oral care  BID Other Recommendations: Order thickener from pharmacy   Follow up Recommendations  None      Swallow Study   General Date of Onset: 01/07/16 HPI: This is 60 y/o male born with congential weakness, limited physical and mental abilities (no clear diagnosis). He is now bed bound and at baseline Not conversant. He has dysphagia, on a pureed diet and honey thickened liquids. This AM he was ok per dad. By 1 PM he started looking sluggish. He developed a temperature of 101. He became tachycardic and lethargic. He had an elevated temperature. His dad called his PCP who directed him to the ER. In the Er the patient's temperature was 100.4, quickly increased to 104. He became nausea, vomiting, tachycardic up to 130, hypertensive(systolic BP up to 202). The hospitalist have been asked to admit for sepsis d/t UTI. History provided by patients father. Type of Study: Bedside Swallow Evaluation Previous Swallow Assessment: 06/11/2015 - recommended dysphgia 1 with honey thick liquids d/t moderate oropharyngeal phase dysphagia Diet Prior to this Study: Dysphagia 1 (puree);Honey-thick liquids Temperature Spikes Noted: Yes Respiratory Status: Room air History of Recent Intubation: No Behavior/Cognition: Alert;Requires cueing;Distractible Oral Cavity Assessment: Within Functional Limits Oral Care Completed by SLP: No Oral Cavity - Dentition: Poor condition Vision:  (N/A) Self-Feeding Abilities: Total assist Patient Positioning: Upright in bed Baseline Vocal Quality: Low vocal intensity (At baseline) Volitional Cough: Cognitively unable to elicit Volitional Swallow: Unable to elicit    Oral/Motor/Sensory Function Overall Oral Motor/Sensory Function: Moderate impairment Facial ROM:  (Pt at baseline) Facial Symmetry:  (At baseline) Facial Strength:  (  At baseline) Facial Sensation:  (Baseline) Lingual ROM:  (Decreased but at baseline) Lingual Symmetry:  (Baseline) Lingual Strength:  (Decreased but at  baseline) Lingual Sensation:  (Decreased but at baseline) Velum:  (N/A) Mandible: Impaired (Baseline)   Ice Chips Ice chips: Not tested   Thin Liquid Thin Liquid: Not tested    Nectar Thick     Honey Thick Honey Thick Liquid: Within functional limits Presentation: Spoon (Fed by father)   Puree Puree: Within functional limits Presentation: Spoon (Fed by father)   Solid   GO   Solid: Not tested        Lizann Edelman 01/08/2016,1:24 PM

## 2016-01-08 NOTE — Progress Notes (Signed)
Dr. Joneen Roachrosley notified new orders given.

## 2016-01-08 NOTE — Progress Notes (Signed)
Patient temp, 99.1 axillary post tylenol administration

## 2016-01-08 NOTE — Progress Notes (Signed)
TC to MD, new order to update diet to same as home diet.

## 2016-01-08 NOTE — Clinical Social Work Note (Signed)
Clinical Social Work Assessment  Patient Details  Name: Carlos [name] MRN: [id number] Date of Birth: 02/02/1956  Date of referral:  01/08/16               Reason for consult:  Facility Placement, Care Management Concerns                Permission sought to share information with:  Facility Contact Representative Permission granted to share information::  No  Name::        Agency::     Relationship::     Contact Information:     Housing/Transportation Living arrangements for the past 2 months:  Single Family Home Source of Information:  Parent Patient Interpreter Needed:  None Criminal Activity/Legal Involvement Pertinent to Current Situation/Hospitalization:  No - Comment as needed Significant Relationships:  Parents Lives with:  Parents Do you feel safe going back to the place where you live?  Yes Need for family participation in patient care:  Yes (Comment)  Care giving concerns:  Patient lives in [place] with his father James Olkowski [phone].    Social Worker assessment / plan:  Clinical Social Worker (CSW) is familiar with patient and his family from last admission to [facility] in December 2016. Patient has down syndrome, is bed bound and total care. CSW met with patient and his father James was at bedside. Per father he is patient's primary care giver and HPOA. Per father patient has been doing well except he developed a bad UTI. Father reported that he has nursing aides that come in and care for patient during the week. Per father he feels confident that he can manage patient at home. On last admission in December CSW tried to place patient at a SNF however [facility] denied patient a SNF level [facility]. CSW discussed exploring SNF placement again with father. Father declined SNF and reported that he will take patient home from the hospital. CSW made RN Case Manager aware of above. CSW will continue to follow and assist as needed.    Employment status:  Disabled (Comment on  whether or not currently receiving Disability) Insurance information:  Medicare, Medicaid In State PT Recommendations:  Not assessed at this time Information / Referral to community resources:  Other (Comment Required) (Patient will return home. )  Patient/Family's Response to care:  Patient's father wants to take patient home from the hospital.   Patient/Family's Understanding of and Emotional Response to Diagnosis, Current Treatment, and Prognosis:  Patient's father was very pleasant and appreciative of CSW visit.   Emotional Assessment Appearance:  Appears stated age Attitude/Demeanor/Rapport:    Affect (typically observed):  Pleasant Orientation:  Oriented to Self Alcohol / Substance use:  Not Applicable Psych involvement (Current and /or in the community):  No (Comment)  Discharge Needs  Concerns to be addressed:  Discharge Planning Concerns Readmission within the last 30 days:  No Current discharge risk:  Cognitively Impaired, Dependent with Mobility, Chronically ill Barriers to Discharge:  Continued Medical Work up   Morgan,  G, LCSW 01/08/2016, 3:55 PM  

## 2016-01-08 NOTE — Progress Notes (Signed)
BP abnormal see flowsheet prime doc paged awaiting response.

## 2016-01-08 NOTE — Progress Notes (Signed)
Patient is Alert this am, some communication. Patient's father stated he is at baseline

## 2016-01-08 NOTE — Progress Notes (Signed)
[facility]   PATIENT NAME: Carlos [name]    MR#:  [id number]  DATE OF BIRTH:  1956/03/17  SUBJECTIVE:  CHIEF COMPLAINT:   Chief Complaint  Patient presents with  . Altered Mental Status   Afebrile now. More awake as per family at bedside. REVIEW OF SYSTEMS:    Review of Systems  Unable to perform ROS: mental acuity   DRUG ALLERGIES:  No Known Allergies  VITALS:  Blood pressure 130/65, pulse 91, temperature 99.6 F (37.6 C), temperature source Axillary, resp. rate 20, height 4\' 9"  (1.448 m), weight 49.76 kg (109 lb 11.2 oz), SpO2 100 %.  PHYSICAL EXAMINATION:   Physical Exam  General: Awake Eyes: PERRLA, pink conjunctiva, no scleral icterus ENT: Moist oral mucosa, neck supple, no thyromegaly Lungs: clear to ascultation, no wheeze, no crackles, no use of accessory muscles Cardiovascular: regular rate and rhythm, no regurgitation, no gallops, no murmurs. No carotid bruits, no JVD Abdomen: soft, positive BS, non-tender, non-distended, no organomegaly, not an acute abdomen GU: not examined Neuro: unable to assess. Contractures Musculoskeletal:  unable to properly assess d/t patients somnolence, short limbs, poor muscle tone (chronic) Skin: no rash  LABORATORY PANEL:   CBC  Recent Labs [name] 01/08/16 0418  WBC 15.6*  HGB 14.4  HCT 41.9  PLT 101*   ------------------------------------------------------------------------------------------------------------------ Chemistries   Recent Labs [name] 01/07/16 1800 01/08/16 0418  NA 139 144  K 3.2* 3.1*  CL 100* 110  CO2 31 27  GLUCOSE 132* 112*  BUN 10 11  CREATININE 0.72 1.10  CALCIUM 8.6* 7.2*  AST 19  --   ALT 12*  --   ALKPHOS 78  --   BILITOT 1.2  --    ------------------------------------------------------------------------------------------------------------------  Cardiac Enzymes  Recent Labs [name] 01/07/16 1800  TROPONINI <0.03    ------------------------------------------------------------------------------------------------------------------  RADIOLOGY:  Dg Chest Port 1 View  01/07/2016  CLINICAL DATA:  Altered mental status. EXAM: PORTABLE CHEST 1 VIEW COMPARISON:  06/09/2015 FINDINGS: 1818 hours. Lungs are clear. Cardiopericardial silhouette is at upper limits of normal for size. The visualized bony structures of the thorax are intact with possible chronic posttraumatic deformity in the right humeral head. Telemetry leads overlie the chest. IMPRESSION: Stable.  No acute findings. Electronically Signed   By: Kennith CenterEric  Mansell M.D.   On: 01/07/2016 18:36     ASSESSMENT AND PLAN:   . UTI (lower urinary tract infection) with severe sepsis - POA -cultures ordered and pending -rocephin IV daily - Stop vancomycin and Zosyn  Dysphagia - Started on pured diet with thickened liquids -aspiration precaution  Hypokalemia -replete thru IV and PO  Ulcerative Colitis -stable, on pentasa  Weakness -chronic.   All the records are reviewed and case discussed with Care Management/Social Workerr. Management plans discussed with the patient, family and they are in agreement.  CODE STATUS: DNR  DVT Prophylaxis: SCDs  TOTAL TIME TAKING CARE OF THIS PATIENT: 35 minutes.   POSSIBLE D/C IN 2-3 DAYS, DEPENDING ON CLINICAL CONDITION.  Milagros LollSudini, [name] M.D on 01/08/2016 at 1:39 PM  Between 7am to 6pm - Pager - [phone]  After 6pm go to [email or web address] - password [email or web address] [facility]  [facility]  Office  [phone]  CC: Primary care physician; Orene DesanctisBEHLING,KAREN, MD  Note: This dictation was prepared with Dragon dictation along with smaller phrase technology. Any transcriptional errors that result from this process are unintentional.

## 2016-01-08 NOTE — Progress Notes (Signed)
Initial Nutrition Assessment  DOCUMENTATION CODES:   Not applicable  INTERVENTION:   Cater to pt preferences on Dysphagia I, honey thick diet order. SLP following. Medical Food Supplement Therapy: will recommend honey thick Mighty Shakes on meal trays TID and Magic Cup BID for added nutrition (each supplement provides approximately 300kcals and 9g protein)   NUTRITION DIAGNOSIS:   Swallowing difficulty related to dysphagia as evidenced by  (current diet order, SLP following).  GOAL:   Patient will meet greater than or equal to 90% of their needs  MONITOR:   PO intake, Supplement acceptance, Labs, Weight trends, I & O's  REASON FOR ASSESSMENT:   Consult Assessment of nutrition requirement/status  ASSESSMENT:   Pt admitted with fever and sepsis secondary to UTI.  Past Medical History  Diagnosis Date  . Ulcerative colitis (HCC)   . Down syndrome     more likley CP  . Hypertension     Diet Order:  DIET - DYS 1 Room service appropriate?: No; Fluid consistency:: Honey Thick   Pt non-communicative with RD on visit, father had just left per RN.  Per RN Victorino DikeJennifer pt getting back to baseline, pureed with honey thick liquids last admission. Father feeds pt and per report pt takes a long time to eat but he will eat well.   Medications: Rocephin, NS with KCl at 7275mL/hr Labs: K 3.1, glucose 112   Gastrointestinal Profile: Last BM:  01/08/2016   Nutrition-Focused Physical Exam Findings:  Unable to complete Nutrition-Focused physical exam at this time.    Weight Change: Per [facility] weight trends pt with 14% weight loss in 7 months   Skin:  Reviewed, no issues   Height:   Ht Readings from Last 1 Encounters:  01/07/16 4\' 9"  (1.448 m)    Weight:   Wt Readings from Last 1 Encounters:  01/08/16 109 lb 11.2 oz (49.76 kg)   Wt Readings from Last 10 Encounters:  01/08/16 109 lb 11.2 oz (49.76 kg)  06/11/15 127 lb 8 oz (57.834 kg)    BMI:  Body mass index is 23.73  kg/(m^2).  Estimated Nutritional Needs:   Kcal:  1500-1750kcals  Protein:  55-65g protein  Fluid:  >/= 1.6L fluid  EDUCATION NEEDS:   Education needs no appropriate at this time   Leda QuailAllyson Robet Crutchfield, RD, LDN Pager [phone] Weekend/On-Call Pager [phone]

## 2016-01-08 NOTE — Progress Notes (Signed)
Patient is AXO x 1. Meds to be crushed in applesauce.

## 2016-01-08 NOTE — Progress Notes (Signed)
Pt asympathic. No acute distress noted. Nursing interventions taking. IV bolus infusing.

## 2016-01-09 LAB — BASIC METABOLIC PANEL
ANION GAP: 5 (ref 5–15)
BUN: 11 mg/dL (ref 6–20)
CO2: 26 mmol/L (ref 22–32)
Calcium: 7.4 mg/dL — ABNORMAL LOW (ref 8.9–10.3)
Chloride: 116 mmol/L — ABNORMAL HIGH (ref 101–111)
Creatinine, Ser: 0.68 mg/dL (ref 0.61–1.24)
GFR calc Af Amer: >60 mL/min (ref >60)
GLUCOSE: 99 mg/dL (ref 65–99)
POTASSIUM: 3.6 mmol/L (ref 3.5–5.1)
Sodium: 147 mmol/L — ABNORMAL HIGH (ref 135–145)

## 2016-01-09 LAB — CBC
HCT: 38.8 % — ABNORMAL LOW (ref 40.0–52.0)
HEMOGLOBIN: 13.7 g/dL (ref 13.0–18.0)
MCH: 31.3 pg (ref 26.0–34.0)
MCHC: 35.4 g/dL (ref 32.0–36.0)
MCV: 88.5 fL (ref 80.0–100.0)
PLATELETS: 67 10*3/uL — AB (ref 150–440)
RBC: 4.38 MIL/uL — ABNORMAL LOW (ref 4.40–5.90)
RDW: 14.6 % — AB (ref 11.5–14.5)
WBC: 10.3 10*3/uL (ref 3.8–10.6)

## 2016-01-09 MED ORDER — POTASSIUM CL IN DEXTROSE 5% 20 MEQ/L IV SOLN
20.0000 meq | INTRAVENOUS | Status: DC
  Administered 2016-01-09: 20 meq via INTRAVENOUS
  Filled 2016-01-09 (×2): qty 1000

## 2016-01-09 MED ORDER — POTASSIUM CL IN DEXTROSE 5% 20 MEQ/L IV SOLN
20.0000 meq | INTRAVENOUS | Status: DC
  Administered 2016-01-09: 20 meq via INTRAVENOUS
  Filled 2016-01-09: qty 1000

## 2016-01-09 NOTE — Clinical Documentation Improvement (Signed)
Internal Medicine Please update your documentation within the medical record to reflect your response to this query. Thank you  Can the diagnosis of altered mental status be further specified?   Encephalopathy - Alcoholic, Anoxic/Hypoxia, Drug Induced/Toxic (specify drug), Hepatic, Hypertensive, Hypoglycemic, Metabolic/Septic, Traumatic/post concussive, Wernicke, Other  Other  Clinically Undetermined  Document any associated diagnoses/conditions.  Supporting Information: 01/07/16 H&P.Marland Kitchen.Marland Kitchen."Increased somnolence"... 01/08/16 progr note.Marland Kitchen.Marland Kitchen."Altered Mental Status- Afebrile now. More awake as per family at bedside"...  Please exercise your independent, professional judgment when responding. A specific answer is not anticipated or expected.  Thank You,  Toribio Harbourphelia R Genora Arp, RN, BSN, CCDS Certified Clinical Documentation Specialist [place]: Health Information Management [phone]

## 2016-01-09 NOTE — Care Management (Addendum)
Spoke with patient's father. He can supply transportation to home however he is not happy about patient going home on O2. He is concerned about "something else to manage". I do not see a chronic lung disease diagnosis which is required for insurance for home O2. RN is working on weaning patient- patient is currently off O2 for assessment on room air.Patient O2 sats mid 90's at rest on room air.

## 2016-01-09 NOTE — Care Management Important Message (Signed)
Important Message  Patient Details  Name: Carlos Price MRN: [id number] Date of Birth: Nov 12, 1955   Medicare Important Message Given:  Yes    Olegario MessierKathy A Colbie Sliker 01/09/2016, 1:45 PM

## 2016-01-09 NOTE — Progress Notes (Signed)
[facility] [place] at [facility]   PATIENT NAME: Ramonita LabJimmie Thaw    MR#:  [id number]  DATE OF BIRTH:  11-08-1955  SUBJECTIVE:  CHIEF COMPLAINT:   Chief Complaint  Patient presents with  . Altered Mental Status   Afebrile now. Dad at bedside.  REVIEW OF SYSTEMS:    Review of Systems  Unable to perform ROS: mental acuity   DRUG ALLERGIES:  No Known Allergies  VITALS:  Blood pressure 154/72, pulse 87, temperature 98.5 F (36.9 C), temperature source Oral, resp. rate 18, height 4\' 9"  (1.448 m), weight 49.76 kg (109 lb 11.2 oz), SpO2 92 %.  PHYSICAL EXAMINATION:   Physical Exam  General: Awake Eyes: PERRLA, pink conjunctiva, no scleral icterus ENT: Moist oral mucosa, neck supple, no thyromegaly Lungs: clear to ascultation, no wheeze, no crackles, no use of accessory muscles Cardiovascular: regular rate and rhythm, no regurgitation, no gallops, no murmurs. No carotid bruits, no JVD Abdomen: soft, positive BS, non-tender, non-distended, no organomegaly, not an acute abdomen Neuro: unable to assess. Contractures Musculoskeletal:  Not following commands, short limbs, poor muscle tone (chronic) Skin: no rash  LABORATORY PANEL:   CBC  Recent Labs Lab 01/09/16 0357  WBC 10.3  HGB 13.7  HCT 38.8*  PLT 67*  ------------------------------------------------------------------------------------------------------------------ Chemistries   Recent Labs Lab 01/07/16 1800 01/08/16 0418 01/09/16 0357  NA 139 144 147*  K 3.2* 3.1* 3.6  CL 100* 110 116*  CO2 31 27 26   GLUCOSE 132* 112* 99  BUN 10 11 11   CREATININE 0.72 1.10 0.68  CALCIUM 8.6* 7.2* 7.4*  MG  --  1.8  --   AST 19  --   --   ALT 12*  --   --   ALKPHOS 78  --   --   BILITOT 1.2  --   --   ------------------------------------------------------------------------------------------------------------------ Cardiac Enzymes  Recent Labs Lab 01/07/16 1800  TROPONINI <0.03    ------------------------------------------------------------------------------------------------------------------  RADIOLOGY:  Dg Chest Port 1 View  01/07/2016  CLINICAL DATA:  Altered mental status. EXAM: PORTABLE CHEST 1 VIEW COMPARISON:  06/09/2015 FINDINGS: 1818 hours. Lungs are clear. Cardiopericardial silhouette is at upper limits of normal for size. The visualized bony structures of the thorax are intact with possible chronic posttraumatic deformity in the right humeral head. Telemetry leads overlie the chest. IMPRESSION: Stable.  No acute findings. Electronically Signed   By: Kennith CenterEric  Mansell M.D.   On: 01/07/2016 18:36     ASSESSMENT AND PLAN:   . Gram negative rod UTI (lower urinary tract infection) with severe sepsis -cultures ordered and pending -rocephin IV daily - Stop vancomycin and Zosyn  Dysphagia, chronic - Started on pured diet with thickened liquids -aspiration precaution  Hypokalemia -replete thru IV and PO  Ulcerative Colitis -stable, on pentasa  Weakness -chronic.   All the records are reviewed and case discussed with Care Management/Social Workerr. Management plans discussed with the patient, family and they are in agreement.  CODE STATUS: DNR  DVT Prophylaxis: SCDs  TOTAL TIME TAKING CARE OF THIS PATIENT: 35 minutes.   Likely discharge tomorrow.  Milagros LollSudini, Kailei Cowens R M.D on 01/09/2016 at 11:44 AM  Between 7am to 6pm - Pager - [phone]  After 6pm go to [email or web address] - password [email or web address] [facility]  [facility]  Office  [phone]  CC: Primary care physician; Orene DesanctisBEHLING,KAREN, MD  Note: This dictation was prepared with Dragon dictation along with smaller phrase technology. Any transcriptional errors that result from this process are unintentional.

## 2016-01-09 NOTE — Progress Notes (Signed)
Patient with complaints of hand burning. Minimal redness to IV site. Will continue to monitor.

## 2016-01-10 LAB — BASIC METABOLIC PANEL
ANION GAP: 7 (ref 5–15)
BUN: 9 mg/dL (ref 6–20)
CO2: 26 mmol/L (ref 22–32)
Calcium: 8 mg/dL — ABNORMAL LOW (ref 8.9–10.3)
Chloride: 108 mmol/L (ref 101–111)
Creatinine, Ser: 0.67 mg/dL (ref 0.61–1.24)
Glucose, Bld: 109 mg/dL — ABNORMAL HIGH (ref 65–99)
POTASSIUM: 3.4 mmol/L — AB (ref 3.5–5.1)
SODIUM: 141 mmol/L (ref 135–145)

## 2016-01-10 LAB — CBC
HEMATOCRIT: 39.1 % — AB (ref 40.0–52.0)
Hemoglobin: 13.9 g/dL (ref 13.0–18.0)
MCH: 31.7 pg (ref 26.0–34.0)
MCHC: 35.6 g/dL (ref 32.0–36.0)
MCV: 89.1 fL (ref 80.0–100.0)
PLATELETS: 65 10*3/uL — AB (ref 150–440)
RBC: 4.39 MIL/uL — ABNORMAL LOW (ref 4.40–5.90)
RDW: 14 % (ref 11.5–14.5)
WBC: 10.3 10*3/uL (ref 3.8–10.6)

## 2016-01-10 LAB — URINE CULTURE

## 2016-01-10 MED ORDER — CIPROFLOXACIN HCL 500 MG PO TABS: 500.0000 mg | ORAL_TABLET | Freq: Two times a day (BID) | ORAL | Status: DC

## 2016-01-10 MED ORDER — IRBESARTAN 150 MG PO TABS
300.0000 mg | ORAL_TABLET | Freq: Every day | ORAL | Status: DC
  Administered 2016-01-10: 300 mg via ORAL
  Filled 2016-01-10: qty 2

## 2016-01-10 NOTE — Progress Notes (Signed)
Pt discharged to home. Prescriptions picked up, administered IV abt prior to d/c. Pt transported by car.

## 2016-01-10 NOTE — Discharge Instructions (Signed)
Resume diet and activity as before ° ° °

## 2016-01-10 NOTE — Progress Notes (Signed)
Pt discharging to home. Father at bedside, explained d/c instructions and meds. Will give next dose IV abt. before leaving. Rx's given and e-scribed to pharmacy.

## 2016-01-10 NOTE — Clinical Social Work Note (Signed)
Patient discharging as previously planned to home with his father. No CSW needs. York SpanielMonica Makael Stein MSW,LCSW [phone]

## 2016-01-12 LAB — CULTURE, BLOOD (ROUTINE X 2)
Culture: NO GROWTH
Culture: NO GROWTH

## 2016-01-15 NOTE — Discharge Summary (Signed)
[facility] [place] at [facility]   PATIENT NAME: Carlos Price    MR#:  [id number]  DATE OF BIRTH:  Sep 27, 1955  DATE OF ADMISSION:  01/07/2016 ADMITTING PHYSICIAN: Gery Prayebby Crosley, MD  DATE OF DISCHARGE: 01/10/2016 12:20 PM  PRIMARY CARE PHYSICIAN: Orene DesanctisBEHLING,KAREN, MD   ADMISSION DIAGNOSIS:  Acute cystitis without hematuria [N30.00] Sepsis, due to unspecified organism (HCC) [A41.9]  DISCHARGE DIAGNOSIS:  Active Problems:   UTI (lower urinary tract infection)   Ulcerative colitis (HCC)   Dysphagia   SECONDARY DIAGNOSIS:   Past Medical History  Diagnosis Date  . Ulcerative colitis (HCC)   . Down syndrome     more likley CP  . Hypertension      ADMITTING HISTORY  HPI: this is 60 y/o male born wityh congential weakness, limited physical and mental abilities (no clear diagnosis). He is now bed bound and at baseline Not conversant. He has dysphagia, on a pureed diet and honey thickened liquids. This AM he was ok per dad. By 1 PM he started looking sluggish. He developed a temperature of 101. He became tachycardic and lethargic. He had an elevated temperature. His dad called his PCP who directed him to the ER. In the Er the patient's temperature was 100.4, quickly increased to 104. He became nausea, vomiting, tachycardic up to 130, hypertensive(systolic BP up to 202). The hospitalist have been asked to admit for sepsis d/t UTI. History provided by patients father. [phone] (H), [phone] (c)  HOSPITAL COURSE:   . escherichia coli and Klebsiella UTI with severe sepsis -rocephin IV daily - Stopped vancomycin and Zosyn - Change to ciprofloxacin at discharge for 5 days - Blood cx negative  Dysphagia, chronic - Started on pured diet with thickened liquids -aspiration precaution  Hypokalemia -repleted thru IV and PO  Ulcerative Colitis -stable, on pentasa  Weakness -chronic.   STABLE FOR DISCHARGE HOME  CONSULTS OBTAINED:     DRUG  ALLERGIES:  No Known Allergies  DISCHARGE MEDICATIONS:   Discharge Medication List as of 01/10/2016 10:17 AM    START taking these medications   Details  ciprofloxacin (CIPRO) 500 MG tablet Take 1 tablet (500 mg total) by mouth 2 (two) times daily., Starting 01/10/2016, Until Discontinued, Print      CONTINUE these medications which have NOT CHANGED   Details  mesalamine (CANASA) 1000 MG suppository Place 1,000 mg rectally daily as needed., Until Discontinued, Historical Med    mesalamine (PENTASA) 500 MG CR capsule Take 2,000 mg by mouth 2 (two) times daily., Until Discontinued, Historical Med    spironolactone (ALDACTONE) 100 MG tablet Take 100 mg by mouth daily. Patient has tablet compounded [facility] 5mL suspension at [facility], Until Discontinued, Historical Med    valsartan (DIOVAN) 80 MG tablet Take 80-160 mg by mouth 2 (two) times daily. 160mg  in the morning and 80 in the evening, Until Discontinued, Historical Med        Today   VITAL SIGNS:  Blood pressure 163/88, pulse 82, temperature 98.7 F (37.1 C), temperature source Oral, resp. rate 20, height 4\' 9"  (1.448 m), weight 49.76 kg (109 lb 11.2 oz), SpO2 93 %.  I/O:  No intake or output data in the 24 hours ending 01/15/16 0902  PHYSICAL EXAMINATION:  Physical Exam  GENERAL:  60 y.o.-year-old patient lying in the bed with no acute distress.  LUNGS: Normal breath sounds bilaterally, no wheezing, rales,rhonchi or crepitation. No use of accessory muscles of respiration.  CARDIOVASCULAR: S1, S2 normal. No murmurs,  rubs, or gallops.  NEUROLOGIC: Contractures. Doesn't follow commands. Dysarthria PSYCHIATRIC: The patient is alert and awake. SKIN: No obvious rash, lesion, or ulcer.   DATA REVIEW:   CBC  Recent Labs Price 01/10/16 0425  WBC 10.3  HGB 13.9  HCT 39.1*  PLT 65*    Chemistries   Recent Labs Price 01/10/16 0425  NA 141  K 3.4*  CL 108  CO2 26  GLUCOSE 109*  BUN 9  CREATININE 0.67  CALCIUM  8.0*    Cardiac Enzymes No results for input(s): TROPONINI in the last 168 hours.  Microbiology Results  Results for orders placed or performed during the hospital encounter of 01/07/16  Urine culture     Status: Abnormal   Collection Time: 01/07/16  5:56 PM  Result Value Ref Range Status   Specimen Description URINE, RANDOM  Final   Special Requests NONE  Final   Culture (A)  Final    >=100,000 COLONIES/mL ESCHERICHIA COLI >=100,000 COLONIES/mL KLEBSIELLA OXYTOCA    Report Status 01/10/2016 FINAL  Final   Organism ID, Bacteria ESCHERICHIA COLI (A)  Final   Organism ID, Bacteria KLEBSIELLA OXYTOCA (A)  Final      Susceptibility   Escherichia coli - MIC*    AMPICILLIN 4 SENSITIVE Sensitive     CEFAZOLIN <=4 SENSITIVE Sensitive     CEFTRIAXONE <=1 SENSITIVE Sensitive     CIPROFLOXACIN <=0.25 SENSITIVE Sensitive     GENTAMICIN <=1 SENSITIVE Sensitive     IMIPENEM <=0.25 SENSITIVE Sensitive     NITROFURANTOIN <=16 SENSITIVE Sensitive     TRIMETH/SULFA <=20 SENSITIVE Sensitive     AMPICILLIN/SULBACTAM <=2 SENSITIVE Sensitive     PIP/TAZO <=4 SENSITIVE Sensitive     Extended ESBL NEGATIVE Sensitive     * >=100,000 COLONIES/mL ESCHERICHIA COLI   Klebsiella oxytoca - MIC*    AMPICILLIN >=32 RESISTANT Resistant     CEFAZOLIN 8 SENSITIVE Sensitive     CEFTRIAXONE <=1 SENSITIVE Sensitive     CIPROFLOXACIN <=0.25 SENSITIVE Sensitive     GENTAMICIN <=1 SENSITIVE Sensitive     IMIPENEM <=0.25 SENSITIVE Sensitive     NITROFURANTOIN 32 SENSITIVE Sensitive     TRIMETH/SULFA <=20 SENSITIVE Sensitive     AMPICILLIN/SULBACTAM 8 SENSITIVE Sensitive     PIP/TAZO <=4 SENSITIVE Sensitive     Extended ESBL NEGATIVE Sensitive     * >=100,000 COLONIES/mL KLEBSIELLA OXYTOCA  Blood Culture (routine x 2)     Status: None   Collection Time: 01/07/16  6:00 PM  Result Value Ref Range Status   Specimen Description BLOOD RIGHT FATTY CASTS  Final   Special Requests BOTTLES DRAWN AEROBIC AND ANAEROBIC   5CC  Final   Culture NO GROWTH 5 DAYS  Final   Report Status 01/12/2016 FINAL  Final  Blood Culture (routine x 2)     Status: None   Collection Time: 01/07/16  6:01 PM  Result Value Ref Range Status   Specimen Description BLOOD LEFT ARM  Final   Special Requests   Final    BOTTLES DRAWN AEROBIC AND ANAEROBIC  AERO 15CC ANA 10CC   Culture NO GROWTH 5 DAYS  Final   Report Status 01/12/2016 FINAL  Final    RADIOLOGY:  No results found.  Follow up with PCP in 1 week.  Management plans discussed with the patient, family and they are in agreement.  CODE STATUS:  Code Status History    Date Active Date Inactive Code Status Order ID Comments User Context  01/07/2016  9:50 PM 01/10/2016  3:21 PM DNR [id number]  Gery Pray, MD Inpatient   06/10/2015  2:11 AM 06/11/2015  5:56 PM Full Code [id number]  Arnaldo Natal, MD Inpatient    Questions for Most Recent Historical Code Status (Order [id number])    Question Answer Comment   In the event of cardiac or respiratory ARREST Do not call a "code blue"    In the event of cardiac or respiratory ARREST Do not perform Intubation, CPR, defibrillation or ACLS    In the event of cardiac or respiratory ARREST Use medication by any route, position, wound care, and other measures to relive pain and suffering. May use oxygen, suction and manual treatment of airway obstruction as needed for comfort.     Advance Directive Documentation        Most Recent Value   Type of Advance Directive  Healthcare Power of Attorney   Pre-existing out of facility DNR order (yellow form or pink MOST form)     "MOST" Form in Place?        TOTAL TIME TAKING CARE OF THIS PATIENT ON DAY OF DISCHARGE: more than 30 minutes.   Milagros Loll R M.D on 01/15/2016 at 9:02 AM  Between 7am to 6pm - Pager - [phone]  After 6pm go to [email or web address] - password [email or web address] [facility]  [place] [facility]  Office  [phone]  CC: Primary care physician; Orene Desanctis,  MD  Note: This dictation was prepared with Dragon dictation along with smaller phrase technology. Any transcriptional errors that result from this process are unintentional.

## 2016-03-02 ENCOUNTER — Emergency Department: Payer: Medicare Other

## 2016-03-02 ENCOUNTER — Encounter: Payer: Self-pay | Admitting: Emergency Medicine

## 2016-03-02 ENCOUNTER — Emergency Department
Admission: EM | Admit: 2016-03-02 | Discharge: 2016-03-03 | Disposition: A | Discharge status: Home / Self Care | Payer: Medicare Other | Attending: Emergency Medicine | Admitting: Emergency Medicine

## 2016-03-02 DIAGNOSIS — Z792 Long term (current) use of antibiotics: Secondary | ICD-10-CM | POA: Diagnosis not present

## 2016-03-02 DIAGNOSIS — R509 Fever, unspecified: Secondary | ICD-10-CM | POA: Diagnosis present

## 2016-03-02 DIAGNOSIS — N390 Urinary tract infection, site not specified: Secondary | ICD-10-CM | POA: Diagnosis not present

## 2016-03-02 DIAGNOSIS — Z79899 Other long term (current) drug therapy: Secondary | ICD-10-CM | POA: Insufficient documentation

## 2016-03-02 DIAGNOSIS — I10 Essential (primary) hypertension: Secondary | ICD-10-CM | POA: Diagnosis not present

## 2016-03-02 LAB — COMPREHENSIVE METABOLIC PANEL
ALBUMIN: 3.7 g/dL (ref 3.5–5.0)
ALT: 19 U/L (ref 17–63)
AST: 24 U/L (ref 15–41)
Alkaline Phosphatase: 71 U/L (ref 38–126)
Anion gap: 8 (ref 5–15)
BILIRUBIN TOTAL: 0.9 mg/dL (ref 0.3–1.2)
BUN: 14 mg/dL (ref 6–20)
CHLORIDE: 100 mmol/L — AB (ref 101–111)
CO2: 27 mmol/L (ref 22–32)
Calcium: 8.1 mg/dL — ABNORMAL LOW (ref 8.9–10.3)
Creatinine, Ser: 0.93 mg/dL (ref 0.61–1.24)
GFR calc Af Amer: >60 mL/min (ref >60)
GFR calc non Af Amer: >60 mL/min (ref >60)
GLUCOSE: 105 mg/dL — AB (ref 65–99)
POTASSIUM: 3.4 mmol/L — AB (ref 3.5–5.1)
Sodium: 135 mmol/L (ref 135–145)
Total Protein: 6.6 g/dL (ref 6.5–8.1)

## 2016-03-02 LAB — CBC
HEMATOCRIT: 40 % (ref 40.0–52.0)
Hemoglobin: 14.2 g/dL (ref 13.0–18.0)
MCH: 31.7 pg (ref 26.0–34.0)
MCHC: 35.5 g/dL (ref 32.0–36.0)
MCV: 89.2 fL (ref 80.0–100.0)
Platelets: 103 10*3/uL — ABNORMAL LOW (ref 150–440)
RBC: 4.48 MIL/uL (ref 4.40–5.90)
RDW: 14.1 % (ref 11.5–14.5)
WBC: 10.2 10*3/uL (ref 3.8–10.6)

## 2016-03-02 LAB — URINALYSIS COMPLETE WITH MICROSCOPIC (ARMC ONLY)
Bilirubin Urine: NEGATIVE
Glucose, UA: NEGATIVE mg/dL
Hgb urine dipstick: NEGATIVE
Ketones, ur: NEGATIVE mg/dL
Nitrite: POSITIVE — AB
PROTEIN: NEGATIVE mg/dL
Specific Gravity, Urine: 1.008 (ref 1.005–1.030)
pH: 6 (ref 5.0–8.0)

## 2016-03-02 MED ORDER — SODIUM CHLORIDE 0.9 % IV BOLUS (SEPSIS)
1000.0000 mL | Freq: Once | INTRAVENOUS | Status: AC
Start: 2016-03-02 — End: 2016-03-03
  Administered 2016-03-02: 1000 mL via INTRAVENOUS

## 2016-03-02 MED ORDER — ACETAMINOPHEN 325 MG PO TABS
650.0000 mg | ORAL_TABLET | Freq: Once | ORAL | Status: AC
  Administered 2016-03-02: 650 mg via ORAL

## 2016-03-02 MED ORDER — DEXTROSE 5 % IV SOLN
1.0000 g | Freq: Once | INTRAVENOUS | Status: AC
  Administered 2016-03-02: 1 g via INTRAVENOUS
  Filled 2016-03-02: qty 10

## 2016-03-02 MED ORDER — CEPHALEXIN 500 MG PO CAPS: 500.0000 mg | ORAL_CAPSULE | Freq: Three times a day (TID) | ORAL | 0 refills | Status: DC

## 2016-03-02 MED ORDER — ACETAMINOPHEN 325 MG PO TABS
ORAL_TABLET | ORAL | Status: AC
  Administered 2016-03-02: 650 mg via ORAL
  Filled 2016-03-02: qty 2

## 2016-03-02 NOTE — ED Notes (Signed)
Patient incontinent of urine, unable to collect urine in triage

## 2016-03-02 NOTE — ED Provider Notes (Signed)
[facility] Emergency Department Provider Note  Time seen: 11:12 PM  I have reviewed the triage vital signs and the nursing notes.   HISTORY  Chief Complaint Shortness of Breath    HPI Carlos Price is a 60 y.o. male with a past medical history of Down syndrome, nonverbal at this line, who presents to the emergency department by his father for evaluation. According to the father and tonight he noted the patient to be acting more tired than normal and appeared to have chills and possibly some difficulty breathing. He states the patient felt warms up him to the emergency department. States the patient has a history of aspiration as well as urinary tract infections in the past. Upon initial evaluation the patient appears well, no distress, lying in bed alert, no apparent trouble breathing. Dad denies any cough.  Past Medical History:  Diagnosis Date  . Down syndrome    more likley CP  . Hypertension   . Ulcerative colitis [facility])     Patient Active Problem List   Diagnosis Date Noted  . Ulcerative colitis (HCC) 01/07/2016  . Dysphagia 01/07/2016  . UTI (lower urinary tract infection) 06/10/2015    Past Surgical History:  Procedure Laterality Date  . none      Prior to Admission medications   Medication Sig Start Date End Date Taking? Authorizing Provider  ciprofloxacin (CIPRO) 500 MG tablet Take 1 tablet (500 mg total) by mouth 2 (two) times daily. 01/10/16   Srikar Sudini, MD  mesalamine (CANASA) 1000 MG suppository Place 1,000 mg rectally daily as needed.    Historical Provider, MD  mesalamine (PENTASA) 500 MG CR capsule Take 2,000 mg by mouth 2 (two) times daily.    Historical Provider, MD  spironolactone (ALDACTONE) 100 MG tablet Take 100 mg by mouth daily. Patient has tablet compounded into 5mL suspension at [facility]    Historical Provider, MD  valsartan (DIOVAN) 80 MG tablet Take 80-160 mg by mouth 2 (two) times daily. 160mg  in the morning and  80 in the evening    Historical Provider, MD    No Known Allergies  Family History  Problem Relation Age of Onset  . Muscular dystrophy Mother     Social History Social History  Substance Use Topics  . Smoking status: Never Smoker  . Smokeless tobacco: Not on file  . Alcohol use No    Review of Systems, Per father. Constitutional: Positive for fever. Respiratory: Negative for cough Gastrointestinal: Negative for vomiting or diarrhea Musculoskeletal: Negative for back pain. Neurological: Negative for headache 10-point ROS otherwise negative.  ____________________________________________   PHYSICAL EXAM:  VITAL SIGNS: ED Triage Vitals [03/02/16 2022]  Enc Vitals Group     BP 130/69     Pulse Rate (!) 120     Resp 20     Temp (!) 101.2 F (38.4 C)     Temp Source Oral     SpO2 94 %     Weight 120 lb (54.4 kg)     Height 4\' 10"  (1.473 m)     Head Circumference      Peak Flow      Pain Score      Pain Loc      Pain Edu?      Excl. in GC?     Constitutional: Alert. Well appearing and in no distress. Eyes: Normal exam ENT   Head: Normocephalic and atraumatic   Mouth/Throat: Mucous membranes are moist. Cardiovascular: Normal rate, regular rhythm.  No murmur Respiratory: Normal respiratory effort without tachypnea nor retractions. Breath sounds are clear Gastrointestinal: Soft and nontender. No distention. Musculoskeletal: Nontender with normal range of motion in all extremities. Neurologic:  Normal speech and language. No gross focal neurologic deficits  Skin:  Skin is warm, dry and intact.  Psychiatric: Mood and affect are normal. Speech and behavior are normal.   ____________________________________________   INITIAL IMPRESSION / ASSESSMENT AND PLAN / ED COURSE  Pertinent labs & imaging results that were available during my care of the patient were reviewed by me and considered in my medical decision making (see chart for details).  Patient with  a past medical history of Down syndrome, nonverbal at baseline presents the emergency department with fever, chills. Patient's workup shows a clear chest x-ray, blood work was within normal limits of the patient has a significant urinary tract infection on urinalysis. We will cover with IV Rocephin. The patient is afebrile upon arrival to the emergency department, which has come down after antipyretic. Father denies any vomiting. I discussed with the father treatment with the IV antibiotic imminent discharges an oral antibiotic, with very close follow-up with his primary care doctor very strict return precautions per the patient's father is agreeable to this plan.  ____________________________________________   FINAL CLINICAL IMPRESSION(S) / ED DIAGNOSES  Urinary tract infection Fever    Minna AntisKevin Briyana Badman, MD 03/02/16 2316

## 2016-03-02 NOTE — ED Triage Notes (Signed)
Father brought patient in with co shortness of breath none noted at present, pt has hx of UTI's, father states did not seem to be in pain, pt is non verbal and hard to evaluate. Pt has a hx of Down Syndrome and hx of aspiration pneumonia.

## 2016-03-05 LAB — URINE CULTURE

## 2016-05-23 ENCOUNTER — Emergency Department
Admission: EM | Admit: 2016-05-23 | Discharge: 2016-05-23 | Disposition: A | Discharge status: Home / Self Care | Payer: Medicare Other | Attending: Emergency Medicine | Admitting: Emergency Medicine

## 2016-05-23 ENCOUNTER — Encounter: Payer: Self-pay | Admitting: Emergency Medicine

## 2016-05-23 DIAGNOSIS — Y69 Unspecified misadventure during surgical and medical care: Secondary | ICD-10-CM | POA: Diagnosis not present

## 2016-05-23 DIAGNOSIS — Z79899 Other long term (current) drug therapy: Secondary | ICD-10-CM | POA: Insufficient documentation

## 2016-05-23 DIAGNOSIS — R21 Rash and other nonspecific skin eruption: Secondary | ICD-10-CM | POA: Diagnosis present

## 2016-05-23 DIAGNOSIS — T50995A Adverse effect of other drugs, medicaments and biological substances, initial encounter: Secondary | ICD-10-CM | POA: Diagnosis not present

## 2016-05-23 DIAGNOSIS — T887XXA Unspecified adverse effect of drug or medicament, initial encounter: Secondary | ICD-10-CM | POA: Insufficient documentation

## 2016-05-23 DIAGNOSIS — T50905A Adverse effect of unspecified drugs, medicaments and biological substances, initial encounter: Secondary | ICD-10-CM

## 2016-05-23 MED ORDER — CIPROFLOXACIN HCL 500 MG PO TABS: 500.0000 mg | ORAL_TABLET | Freq: Two times a day (BID) | ORAL | 0 refills | Status: AC

## 2016-05-23 NOTE — ED Provider Notes (Signed)
[facility] Emergency Department Provider Note   __________________________________________[facility]__   First MD Initiated Contact with Patient 05/23/16 1229     (approximate)  I have reviewed the triage vital signs and the nursing notes.   HISTORY  Chief Complaint Rash    HPI Carlos Price is a 60 y.o. male with a past medical history of Down syndrome\cerebral palsy and currently being treated for UTI , UC and dysphagia. Presents today with complaints of a rash diffusely starting in groin and moving superiorly across the abdomen and back and trunk. Patient recently was recently started on tamsulosin and Macrobid for UTI.   Past Medical History:  Diagnosis Date  . Down syndrome    more likley CP  . Hypertension   . Ulcerative colitis [facility](HCC)     Patient Active Problem List   Diagnosis Date Noted  . Ulcerative colitis (HCC) 01/07/2016  . Dysphagia 01/07/2016  . UTI (lower urinary tract infection) 06/10/2015    Past Surgical History:  Procedure Laterality Date  . none      Prior to Admission medications   Medication Sig Start Date End Date Taking? Authorizing Provider  ciprofloxacin (CIPRO) 500 MG tablet Take 1 tablet (500 mg total) by mouth 2 (two) times daily. 05/23/16 06/02/16  Charmayne Sheerharles M Beers, PA-C  mesalamine (CANASA) 1000 MG suppository Place 1,000 mg rectally daily as needed.    Historical Provider, MD  mesalamine (PENTASA) 500 MG CR capsule Take 2,000 mg by mouth 2 (two) times daily.    Historical Provider, MD  spironolactone (ALDACTONE) 100 MG tablet Take 100 mg by mouth daily. Patient has tablet comp[facility]mL suspension [facility]    Historical Provider, MD  valsartan (DIOVAN) 80 MG tablet Take 80-160 mg by mouth 2 (two) times daily. 160mg  in the morning and 80 in the evening    Historical Provider, MD    Allergies Patient has no known allergies.  Family History  Problem Relation Age of Onset  . Muscular dystrophy Mother      Social History Social History  Substance Use Topics  . Smoking status: Never Smoker  . Smokeless tobacco: Not on file  . Alcohol use No    Review of Systems Constitutional: No fever/chills ENT: No sore throat. Cardiovascular: Denies chest pain. Respiratory: Denies shortness of breath. Gastrointestinal: No abdominal pain.  No nausea, no vomiting.  No diarrhea.  No constipation. Genitourinary: Positive for recent UTI Musculoskeletal: Negative for back pain. Skin: Positive for rash. Diffuse in groin and trunk Neurological: Negative for headaches, focal weakness or numbness.  10-point ROS otherwise negative.  ____________________________________________   PHYSICAL EXAM:  VITAL SIGNS: ED Triage Vitals [05/23/16 1147]  Enc Vitals Group     BP (!) 144/70     Pulse Rate 82     Resp 16     Temp 98.7 F (37.1 C)     Temp Source Oral     SpO2 95 %     Weight 120 lb (54.4 kg)     Height      Head Circumference      Peak Flow      Pain Score      Pain Loc      Pain Edu?      Excl. in GC?     Constitutional: Alert and oriented. Well appearing and in no acute distress.  Cardiovascular: Normal rate, regular rhythm. Grossly normal heart sounds.  Good peripheral circulation. Respiratory: Normal respiratory effort.  No retractions.  Lungs CTAB. Gastrointestinal: Soft and nontender. No distention. No abdominal bruits. No CVA tenderness. Musculoskeletal: No lower extremity tenderness nor edema.  No joint effusions. Neurologic:  Deferred at this visit. Skin:  Skin is warm, dry and intact. Positive for maculopapular lesions noted in the groin and the trunk. Psychiatric: Mood and affect are normal. Speech and behavior are normal.  ____________________________________________   LABS (all labs ordered are listed, but only abnormal results are displayed)  Labs Reviewed - No data to  display ____________________________________________  EKG   ____________________________________________  RADIOLOGY   ____________________________________________   PROCEDURES  Procedure(s) performed: None  Procedures  Critical Care performed: No  ____________________________________________   INITIAL IMPRESSION / ASSESSMENT AND PLAN / ED COURSE  Pertinent labs & imaging results that were available during my care of the patient were reviewed by me and considered in my medical decision making (see chart for details).  Drug reaction. Stop Macrobid 100 mg. Rx given for Cipro 500 mg. Patient to follow up with PCP next week or return to ER with any worsening symptomology encourage use of Benadryl as needed for itching and reaction.  Clinical Course      ____________________________________________   FINAL CLINICAL IMPRESSION(S) / ED DIAGNOSES  Final diagnoses:  Drug reaction, initial encounter      NEW MEDICATIONS STARTED DURING THIS VISIT:  New Prescriptions   CIPROFLOXACIN (CIPRO) 500 MG TABLET    Take 1 tablet (500 mg total) by mouth 2 (two) times daily.     Note:  This document was prepared using Dragon voice recognition software and may include unintentional dictation errors.   Evangeline Dakinharles M Beers, PA-C 05/23/16 1248    Arnaldo NatalPaul F Malinda, MD 05/23/16 [phone]

## 2016-05-23 NOTE — ED Triage Notes (Signed)
Pt started with rash 2 days ago in groin and now on abdomen and arms per dad/caretaker. Tried OTC medicine and it didn't work. Seems to itch. No fevers.

## 2016-05-23 NOTE — Discharge Instructions (Signed)
Stop the Macrobid and take

## 2017-01-04 ENCOUNTER — Inpatient Hospital Stay
Admission: EM | Admit: 2017-01-04 | Discharge: 2017-01-06 | DRG: 872 | Disposition: A | Discharge status: Home / Self Care | Payer: Medicare Other | Attending: Internal Medicine | Admitting: Internal Medicine

## 2017-01-04 ENCOUNTER — Emergency Department: Payer: Medicare Other

## 2017-01-04 ENCOUNTER — Encounter: Payer: Self-pay | Admitting: Emergency Medicine

## 2017-01-04 DIAGNOSIS — Q909 Down syndrome, unspecified: Secondary | ICD-10-CM | POA: Diagnosis not present

## 2017-01-04 DIAGNOSIS — A419 Sepsis, unspecified organism: Secondary | ICD-10-CM | POA: Diagnosis not present

## 2017-01-04 DIAGNOSIS — R000 Tachycardia, unspecified: Secondary | ICD-10-CM | POA: Diagnosis present

## 2017-01-04 DIAGNOSIS — B9620 Unspecified Escherichia coli [E. coli] as the cause of diseases classified elsewhere: Secondary | ICD-10-CM | POA: Diagnosis present

## 2017-01-04 DIAGNOSIS — Z79899 Other long term (current) drug therapy: Secondary | ICD-10-CM | POA: Diagnosis not present

## 2017-01-04 DIAGNOSIS — N3000 Acute cystitis without hematuria: Secondary | ICD-10-CM | POA: Diagnosis present

## 2017-01-04 DIAGNOSIS — Z87440 Personal history of urinary (tract) infections: Secondary | ICD-10-CM

## 2017-01-04 DIAGNOSIS — N390 Urinary tract infection, site not specified: Secondary | ICD-10-CM | POA: Diagnosis present

## 2017-01-04 DIAGNOSIS — I10 Essential (primary) hypertension: Secondary | ICD-10-CM | POA: Diagnosis present

## 2017-01-04 DIAGNOSIS — Z66 Do not resuscitate: Secondary | ICD-10-CM | POA: Diagnosis present

## 2017-01-04 LAB — CBC WITH DIFFERENTIAL/PLATELET
BASOS ABS: 0 10*3/uL (ref 0–0.1)
BASOS PCT: 0 %
Eosinophils Absolute: 0 10*3/uL (ref 0–0.7)
Eosinophils Relative: 0 %
HCT: 38.7 % — ABNORMAL LOW (ref 40.0–52.0)
Hemoglobin: 13.3 g/dL (ref 13.0–18.0)
LYMPHS PCT: 4 %
Lymphs Abs: 0.4 10*3/uL — ABNORMAL LOW (ref 1.0–3.6)
MCH: 30.8 pg (ref 26.0–34.0)
MCHC: 34.4 g/dL (ref 32.0–36.0)
MCV: 89.6 fL (ref 80.0–100.0)
MONO ABS: 0.4 10*3/uL (ref 0.2–1.0)
Monocytes Relative: 4 %
NEUTROS ABS: 10.4 10*3/uL — AB (ref 1.4–6.5)
NEUTROS PCT: 92 %
Platelets: 118 10*3/uL — ABNORMAL LOW (ref 150–440)
RBC: 4.32 MIL/uL — AB (ref 4.40–5.90)
RDW: 13.4 % (ref 11.5–14.5)
WBC: 11.2 10*3/uL — ABNORMAL HIGH (ref 3.8–10.6)

## 2017-01-04 LAB — URINALYSIS, COMPLETE (UACMP) WITH MICROSCOPIC
BILIRUBIN URINE: NEGATIVE
Glucose, UA: NEGATIVE mg/dL
HGB URINE DIPSTICK: NEGATIVE
KETONES UR: NEGATIVE mg/dL
NITRITE: POSITIVE — AB
PROTEIN: NEGATIVE mg/dL
Specific Gravity, Urine: 1.01 (ref 1.005–1.030)
pH: 7 (ref 5.0–8.0)

## 2017-01-04 LAB — COMPREHENSIVE METABOLIC PANEL
ALBUMIN: 3.5 g/dL (ref 3.5–5.0)
ALT: 40 U/L (ref 17–63)
ANION GAP: 11 (ref 5–15)
AST: 63 U/L — ABNORMAL HIGH (ref 15–41)
Alkaline Phosphatase: 116 U/L (ref 38–126)
BILIRUBIN TOTAL: 0.8 mg/dL (ref 0.3–1.2)
BUN: 16 mg/dL (ref 6–20)
CO2: 25 mmol/L (ref 22–32)
Calcium: 8.3 mg/dL — ABNORMAL LOW (ref 8.9–10.3)
Chloride: 103 mmol/L (ref 101–111)
Creatinine, Ser: 0.79 mg/dL (ref 0.61–1.24)
GFR calc Af Amer: >60 mL/min (ref >60)
GFR calc non Af Amer: >60 mL/min (ref >60)
GLUCOSE: 112 mg/dL — AB (ref 65–99)
POTASSIUM: 3.6 mmol/L (ref 3.5–5.1)
SODIUM: 139 mmol/L (ref 135–145)
TOTAL PROTEIN: 6.8 g/dL (ref 6.5–8.1)

## 2017-01-04 LAB — LACTIC ACID, PLASMA
LACTIC ACID, VENOUS: 3.6 mmol/L — AB (ref 0.5–1.9)
Lactic Acid, Venous: 2.4 mmol/L (ref 0.5–1.9)
Lactic Acid, Venous: 6.7 mmol/L (ref 0.5–1.9)

## 2017-01-04 MED ORDER — POLYETHYLENE GLYCOL 3350 17 G PO PACK: 17.0000 g | PACK | Freq: Every day | ORAL | Status: DC | PRN

## 2017-01-04 MED ORDER — FAMOTIDINE 20 MG PO TABS
20.0000 mg | ORAL_TABLET | Freq: Two times a day (BID) | ORAL | Status: DC
  Administered 2017-01-05 – 2017-01-06 (×3): 20 mg via ORAL
  Filled 2017-01-04 (×3): qty 1

## 2017-01-04 MED ORDER — SODIUM CHLORIDE 0.9 % IV BOLUS (SEPSIS)
1000.0000 mL | Freq: Once | INTRAVENOUS | Status: AC
  Administered 2017-01-04: 1000 mL via INTRAVENOUS

## 2017-01-04 MED ORDER — SODIUM CHLORIDE 0.9 % IV BOLUS (SEPSIS)
500.0000 mL | Freq: Once | INTRAVENOUS | Status: AC
  Administered 2017-01-04: 500 mL via INTRAVENOUS

## 2017-01-04 MED ORDER — SODIUM CHLORIDE 0.9 % IV SOLN
INTRAVENOUS | Status: DC
  Administered 2017-01-04 – 2017-01-05 (×2): via INTRAVENOUS

## 2017-01-04 MED ORDER — VANCOMYCIN HCL IN DEXTROSE 1-5 GM/200ML-% IV SOLN
1000.0000 mg | Freq: Once | INTRAVENOUS | Status: AC
  Administered 2017-01-04: 1000 mg via INTRAVENOUS
  Filled 2017-01-04: qty 200

## 2017-01-04 MED ORDER — ACETAMINOPHEN 650 MG RE SUPP
650.0000 mg | Freq: Once | RECTAL | Status: AC
  Administered 2017-01-04: 650 mg via RECTAL

## 2017-01-04 MED ORDER — ONDANSETRON HCL 4 MG/2ML IJ SOLN
4.0000 mg | Freq: Once | INTRAMUSCULAR | Status: AC
  Administered 2017-01-04: 4 mg via INTRAVENOUS
  Filled 2017-01-04: qty 2

## 2017-01-04 MED ORDER — SODIUM CHLORIDE 0.9 % IV SOLN
1000.0000 mL | Freq: Once | INTRAVENOUS | Status: AC
  Administered 2017-01-04: 1000 mL via INTRAVENOUS

## 2017-01-04 MED ORDER — ACETAMINOPHEN 325 MG PO TABS: 650.0000 mg | ORAL_TABLET | Freq: Four times a day (QID) | ORAL | Status: DC | PRN

## 2017-01-04 MED ORDER — ACETAMINOPHEN 650 MG RE SUPP
RECTAL | Status: AC
Start: 2017-01-04 — End: 2017-01-04
  Administered 2017-01-04: 650 mg via RECTAL
  Filled 2017-01-04: qty 1

## 2017-01-04 MED ORDER — ENOXAPARIN SODIUM 40 MG/0.4ML ~~LOC~~ SOLN
40.0000 mg | SUBCUTANEOUS | Status: DC
Start: 2017-01-04 — End: 2017-01-06
  Administered 2017-01-04 – 2017-01-05 (×2): 40 mg via SUBCUTANEOUS
  Filled 2017-01-04 (×2): qty 0.4

## 2017-01-04 MED ORDER — ONDANSETRON HCL 4 MG/2ML IJ SOLN
4.0000 mg | Freq: Four times a day (QID) | INTRAMUSCULAR | Status: DC | PRN
  Administered 2017-01-05: 19:00:00 4 mg via INTRAVENOUS
  Filled 2017-01-04: qty 2

## 2017-01-04 MED ORDER — MESALAMINE ER 250 MG PO CPCR
2000.0000 mg | ORAL_CAPSULE | Freq: Two times a day (BID) | ORAL | Status: DC
  Filled 2017-01-04 (×2): qty 8

## 2017-01-04 MED ORDER — PIPERACILLIN-TAZOBACTAM 3.375 G IVPB
3.3750 g | Freq: Three times a day (TID) | INTRAVENOUS | Status: DC
Start: 2017-01-04 — End: 2017-01-06
  Administered 2017-01-04 – 2017-01-06 (×5): 3.375 g via INTRAVENOUS
  Filled 2017-01-04 (×7): qty 50

## 2017-01-04 MED ORDER — SPIRONOLACTONE 5 MG/ML ORAL SUSPENSION
25.0000 mg | Freq: Every day | ORAL | Status: DC
  Filled 2017-01-04: qty 5

## 2017-01-04 MED ORDER — BENEFIBER PO POWD: Freq: Three times a day (TID) | ORAL | Status: DC

## 2017-01-04 MED ORDER — MESALAMINE ER 250 MG PO CPCR
2000.0000 mg | ORAL_CAPSULE | Freq: Two times a day (BID) | ORAL | Status: DC
  Administered 2017-01-05 – 2017-01-06 (×3): 2000 mg via ORAL
  Filled 2017-01-04 (×5): qty 8

## 2017-01-04 MED ORDER — PIPERACILLIN-TAZOBACTAM 3.375 G IVPB 30 MIN
3.3750 g | Freq: Once | INTRAVENOUS | Status: AC
Start: 2017-01-04 — End: 2017-01-04
  Administered 2017-01-04: 3.375 g via INTRAVENOUS
  Filled 2017-01-04: qty 50

## 2017-01-04 MED ORDER — ACETAMINOPHEN 650 MG RE SUPP
950.0000 mg | Freq: Once | RECTAL | Status: AC
  Administered 2017-01-04: 1010 mg via RECTAL
  Filled 2017-01-04: qty 3

## 2017-01-04 MED ORDER — ONDANSETRON HCL 4 MG PO TABS: 4.0000 mg | ORAL_TABLET | Freq: Four times a day (QID) | ORAL | Status: DC | PRN

## 2017-01-04 MED ORDER — ACETAMINOPHEN 650 MG RE SUPP: 650.0000 mg | Freq: Four times a day (QID) | RECTAL | Status: DC | PRN

## 2017-01-04 NOTE — ED Notes (Signed)
[facility]

## 2017-01-04 NOTE — Progress Notes (Signed)
Family Meeting Note  Advance Directive: Yes  Following meeting  took place with the father in the ER   Patient is unable to participate due ZO:XWRUEAto:Lacked capacity Down syndrome/sellar protocol disease     The following were discussed:Patient's diagnosis: , Patient's progosis: Patient is being admitted with sepsis due to UTI. He is critical but stable. Father is the healthcare power of attorney. Patient is a DO NOT RESUSCITATE according to the father. He tells me he is a legal guardian about the patient.   CODE STATUS DO NOT RESUSCITATE   Time spent during discussion: 16 minutes   Cecilio Ohlrich, MD

## 2017-01-04 NOTE — Progress Notes (Addendum)
Nursing supervisor called and said 2C is not comfortable taking patient with high-grade fever. His vitals otherwise is stable with heart rate 95 and blood pressure 110 systolic. We'll change him to step down ICU

## 2017-01-04 NOTE — ED Notes (Signed)
Family updated on new bed assignment

## 2017-01-04 NOTE — ED Notes (Signed)
Pt cleaned up and changed. Pt assisted with urinal. Pt now resting comfortably.

## 2017-01-04 NOTE — Progress Notes (Signed)
Pharmacy Antibiotic Note  Carlos NewcomerJimmie [name] is a 61 y.o. male admitted on 01/04/2017 with sepsis and UTI.  Pharmacy has been consulted for Zosyn dosing. Pt received vancomycin and Zosyn x1 in the ED.  Plan: Zosyn 3.375g IV q8h (4 hour infusion).  Height: 4\' 9"  (144.8 cm) Weight: 110 lb (49.9 kg) IBW/kg (Calculated) : 43.1  Temp (24hrs), Avg:101.5 F (38.6 C), Min:99.6 F (37.6 C), Max:103.4 F (39.7 C)   Recent Labs Lab 01/04/17 1217  WBC 11.2*  CREATININE 0.79  LATICACIDVEN 3.6*    Estimated Creatinine Clearance: 60.6 mL/min (by C-G formula based on SCr of 0.79 mg/dL).    No Known Allergies  Antimicrobials this admission: Vanc x1 7/3 Zosyn 7/3 >>  Dose adjustments this admission:   Microbiology results: 7/3 BCx: sent 7/3 UCx: sent   Thank you for allowing pharmacy to be a part of this patient's care.  Carlos [name], Carlos [name] 01/04/2017 3:36 PM

## 2017-01-04 NOTE — ED Provider Notes (Signed)
[facility] Emergency Department Provider Note   ____________________________________________    I have reviewed the triage vital signs and the nursing notes.   HISTORY  Chief Complaint Emesis and Aspiration  Patient unable to provide history   HPI Carlos [name] is a 61 y.o. male with a reported history of Down syndrome who presents with altered mental status. EMS reports patient is typically verbal. Reports of nausea and vomiting last night after eating. Today he is febrile and altered, concern about aspiration which is apparently happened in the past. Patient also reportedly has a history of urinary tract infections and in the past has been on chronic antibiotics.   Past Medical History:  Diagnosis Date  . Down syndrome    more likley CP  . Hypertension   . Ulcerative colitis [facility](HCC)     Patient Active Problem List   Diagnosis Date Noted  . Sepsis (HCC) 01/04/2017  . Ulcerative colitis (HCC) 01/07/2016  . Dysphagia 01/07/2016  . UTI (lower urinary tract infection) 06/10/2015    Past Surgical History:  Procedure Laterality Date  . none      Prior to Admission medications   Medication Sig Start Date End Date Taking? Authorizing Provider  famotidine (PEPCID) 20 MG tablet Take 20 mg by mouth 2 (two) times daily.   Yes [provider]  mesalamine (PENTASA) 500 MG CR capsule Take 2,000 mg by mouth 2 (two) times daily.   Yes [provider]  selenium sulfide (SELSUN) 2.5 % shampoo Apply 1 application topically once a week. Thursday   Yes [provider]  spironolactone (ALDACTONE) 5 mg/mL SUSP oral suspension Take 25 mg by mouth daily.   Yes [provider]  Wheat Dextrin (BENEFIBER PO) Take 10 mLs by mouth 3 (three) times daily.   Yes [provider]     Allergies Patient has no known allergies.  Family History  Problem Relation Age of Onset  . Muscular dystrophy Mother     Social  History Social History  Substance Use Topics  . Smoking status: Never Smoker  . Smokeless tobacco: Never Used  . Alcohol use No    Level V caveat: Unable to obtain Review of Systems due to altered mental status    ____________________________________________   PHYSICAL EXAM:  VITAL SIGNS: ED Triage Vitals  Enc Vitals Group     BP 01/04/17 1213 (!) 151/90     Pulse Rate 01/04/17 1213 (!) 123     Resp 01/04/17 1213 18     Temp 01/04/17 1213 (!) 103.4 F (39.7 C)     Temp Source 01/04/17 1213 Oral     SpO2 01/04/17 1213 (!) 89 %     Weight 01/04/17 1214 49.9 kg (110 lb)     Height 01/04/17 1214 1.448 m (4\' 9" )     Head Circumference --      Peak Flow --      Pain Score --      Pain Loc --      Pain Edu? --      Excl. in GC? --     Constitutional: Eyes closed, will open eyes to stimulation Eyes: Conjunctivae are normal.  Head: Atraumatic. Nose: No congestion/rhinnorhea. Mouth/Throat: Mucous membranes are moist.   Neck:  Painless ROM Cardiovascular: Tachycardia regular rhythm.   Good peripheral circulation. Respiratory: Normal respiratory effort.  No retractions. Lungs CTAB. Gastrointestinal: Soft and nontender. No distention.  No CVA tenderness. Genitourinary: deferred Musculoskeletal: No lower extremity tenderness  nor edema.  Warm and well perfused  Skin:  Skin is warm, dry and intact. No rash noted. Psychiatric: Unable to examine  ____________________________________________   LABS (all labs ordered are listed, but only abnormal results are displayed)  Labs Reviewed  LACTIC ACID, PLASMA - Abnormal; Notable for the following:       Result Value   Lactic Acid, Venous 3.6 (*)    All other components within normal limits  COMPREHENSIVE METABOLIC PANEL - Abnormal; Notable for the following:    Glucose, Bld 112 (*)    Calcium 8.3 (*)    AST 63 (*)    All other components within normal limits  CBC WITH DIFFERENTIAL/PLATELET - Abnormal; Notable for the  following:    WBC 11.2 (*)    RBC 4.32 (*)    HCT 38.7 (*)    Platelets 118 (*)    Neutro Abs 10.4 (*)    Lymphs Abs 0.4 (*)    All other components within normal limits  URINALYSIS, COMPLETE (UACMP) WITH MICROSCOPIC - Abnormal; Notable for the following:    Color, Urine YELLOW (*)    APPearance CLEAR (*)    Nitrite POSITIVE (*)    Leukocytes, UA TRACE (*)    Bacteria, UA RARE (*)    Squamous Epithelial / LPF 0-5 (*)    All other components within normal limits  CULTURE, BLOOD (ROUTINE X 2)  CULTURE, BLOOD (ROUTINE X 2)  URINE CULTURE  LACTIC ACID, PLASMA   ____________________________________________  EKG  None ____________________________________________  RADIOLOGY  Chest x-ray unremarkable ____________________________________________   PROCEDURES  Procedure(s) performed: No    Critical Care performed: No ____________________________________________   INITIAL IMPRESSION / ASSESSMENT AND PLAN / ED COURSE  Pertinent labs & imaging results that were available during my care of the patient were reviewed by me and considered in my medical decision making (see chart for details).  Patient presented with altered mental status, he is febrile and tachycardic. Blood pressure stable. White count is mildly elevated. Blood cultures and broad-spectrum antibiotics given for presumed sepsis. Likely related to UTI  Discussed with the hospitalist service for admission    ____________________________________________   FINAL CLINICAL IMPRESSION(S) / ED DIAGNOSES  Final diagnoses:  Sepsis, due to unspecified organism [facility])  Acute cystitis without hematuria      NEW MEDICATIONS STARTED DURING THIS VISIT:  New Prescriptions   No medications on file     Note:  This document was prepared using Dragon voice recognition software and may include unintentional dictation errors.    Jene Every, MD 01/04/17 1534

## 2017-01-04 NOTE — ED Notes (Signed)
Hospitalist notified of pt's temperature. Cold towels put under pt's armpits and orders put in for tylenol to give.

## 2017-01-04 NOTE — ED Notes (Signed)
Pt cleaned up and changed. Pt resting comfortably in bed.

## 2017-01-04 NOTE — ED Notes (Signed)
Date and time results received: 01/04/17 1600 (use smartphrase ".now" to insert current time)  Test: lactic Critical Value: 2.4  Name of Provider Notified: McShane  Orders Received? Or Actions Taken?: Orders Received - See Orders for details

## 2017-01-04 NOTE — ED Notes (Signed)
Pt cleaned and changed. Pt repositioned in bed. Pt now being transported to 1C-116 by EDT Kennith Centerracey

## 2017-01-04 NOTE — ED Notes (Signed)
Hospitalist called and [facility]. Hospitalist to be in new orders for stepdown admit.

## 2017-01-04 NOTE — ED Triage Notes (Signed)
Pt presents via ems from home with fever, vomiting since last night. Pt also has ams, per father, who is caregiver. Pt is usually able to speak and communicate, but is non verbal at triage. EMS states that he vomited once en route and apparently aspirated on emesis.

## 2017-01-04 NOTE — Progress Notes (Signed)
PHARMACIST - PHYSICIAN ORDER COMMUNICATION  CONCERNING: P&T Medication Policy on Herbal Medications  DESCRIPTION:  This patient's order for:  Benefiber  has been noted.  This product(s) is classified as an "herbal" or natural product. Due to a lack of definitive safety studies or FDA approval, nonstandard manufacturing practices, plus the potential risk of unknown drug-drug interactions while on inpatient medications, the Pharmacy and Therapeutics Committee does not permit the use of "herbal" or natural products of this type within Organ.   ACTION TAKEN: The pharmacy department is unable to verify this order at this time and your patient has been informed of this safety policy. Please reevaluate patient's clinical condition at discharge and address if the herbal or natural product(s) should be resumed at that time.  

## 2017-01-04 NOTE — H&P (Signed)
Carlos [name]   PATIENT NAME: Carlos [name]    MR#:  [id number]  DATE OF BIRTH:  Feb 08, 1956  DATE OF ADMISSION:  01/04/2017  PRIMARY CARE PHYSICIAN: Carlos DesanctisBehling, Karen, MD   REQUESTING/REFERRING PHYSICIAN: Dr. Cyril LoosenKinner  CHIEF COMPLAINT:  Carlos [name] felt sick since yesterday and was shivering  History is obtained from patient's father was present in the emergency room. Patient has Down syndrome and at baseline does not communicate much. He does not ambulate.  HISTORY OF PRESENT ILLNESS:  Carlos [name]  is a 61 y.o. male with a known history of Down syndrome, hypertension, history of ulcerative colitis comes to the emergency room with fever of 103.9 and vomiting. Patient's workup in the emergency room showed elevated white count and elevated lactic acid tachycardia with heart rate in the 130s and fever while 3.9. He was found to have UTI. Chest x-ray negative. He received IV vancomycin and Zosyn. Patient is unable to give any history of previous system. Father is present in the ER. His repeat temperature was 99. He is getting IV bolus fluids per sepsis protocol. He is being admitted for sepsis due to UTI.  PAST MEDICAL HISTORY:   Past Medical History:  Diagnosis Date  . Down syndrome    more likley CP  . Hypertension   . Ulcerative colitis (HCC)     PAST SURGICAL HISTOIRY:   Past Surgical History:  Procedure Laterality Date  . none      SOCIAL HISTORY:   Social History  Substance Use Topics  . Smoking status: Never Smoker  . Smokeless tobacco: Never Used  . Alcohol use No    FAMILY HISTORY:   Family History  Problem Relation Age of Onset  . Muscular dystrophy Mother     DRUG ALLERGIES:  No Known Allergies  REVIEW OF SYSTEMS:  Review of Systems  Unable to perform ROS: Mental acuity     MEDICATIONS AT HOME:   Prior to Admission medications   Medication Sig Start Date End Date Taking? Authorizing Provider   famotidine (PEPCID) 20 MG tablet Take 20 mg by mouth 2 (two) times daily.   Yes [provider]  mesalamine (PENTASA) 500 MG CR capsule Take 2,000 mg by mouth 2 (two) times daily.   Yes [provider]  selenium sulfide (SELSUN) 2.5 % shampoo Apply 1 application topically once a week. Thursday   Yes [provider]  spironolactone (ALDACTONE) 5 mg/mL SUSP oral suspension Take 25 mg by mouth daily.   Yes [provider]  Wheat Dextrin (BENEFIBER PO) Take 10 mLs by mouth 3 (three) times daily.   Yes [provider]      VITAL SIGNS:  Blood pressure 124/61, pulse 94, temperature 99.6 F (37.6 C), temperature source Oral, resp. rate (!) 22, height 4\' 9"  (1.448 m), weight 49.9 kg (110 lb), SpO2 100 %.  PHYSICAL EXAMINATION:  GENERAL:  61 y.o.-year-old patient lying in the bed with no acute distress.  EYES: Pupils equal, round, reactive to light and accommodation. No scleral icterus. Extraocular muscles intact. Patient has Down's features HEENT: Head atraumatic, normocephalic. Oropharynx and nasopharynx clear.  NECK:  Supple, no jugular venous distention. No thyroid enlargement, no tenderness.  LUNGS: Normal breath sounds bilaterally, no wheezing, rales,rhonchi or crepitation. No use of accessory muscles of respiration.  CARDIOVASCULAR: S1, S2 normal. No murmurs, rubs, or gallops. Tachycardia ABDOMEN: Soft, nontender, nondistended. Bowel sounds present. No organomegaly or mass.  EXTREMITIES: No pedal edema, cyanosis,  or clubbing.  NEUROLOGIC: Unable to check secondary to sepsis and lethargic  PSYCHIATRIC: The patient is lethargic SKIN: No obvious rash, lesion, or ulcer.   LABORATORY PANEL:   CBC  Recent Labs [name] 01/04/17 1217  WBC 11.2*  HGB 13.3  HCT 38.7*  PLT 118*   ------------------------------------------------------------------------------------------------------------------  Chemistries   Recent Labs [name] 01/04/17 1217  NA  139  K 3.6  CL 103  CO2 25  GLUCOSE 112*  BUN 16  CREATININE 0.79  CALCIUM 8.3*  AST 63*  ALT 40  ALKPHOS 116  BILITOT 0.8   ------------------------------------------------------------------------------------------------------------------  Cardiac Enzymes No results for input(s): TROPONINI in the last 168 hours. ------------------------------------------------------------------------------------------------------------------  RADIOLOGY:  Dg Chest Port 1 View  Result Date: 01/04/2017 CLINICAL DATA:  Fever and vomiting EXAM: PORTABLE CHEST 1 VIEW COMPARISON:  March 02, 2016 FINDINGS: There is no edema or consolidation. Heart size and pulmonary vascularity are normal. No adenopathy. No evident bone lesions. IMPRESSION: No edema or consolidation. Electronically Signed   By: Bretta Bang III M.D.   On: 01/04/2017 12:24    EKG:   Sinus tachycardia on telemetry monitor in the ER IMPRESSION AND PLAN:   Carlos [name]  is a 61 y.o. male with a known history of Down syndrome, hypertension, history of ulcerative colitis comes to the emergency room with fever of 103.9 and vomiting.  1. Sepsis due to UTI -Came in with fever 103.9, tachycardia, lactic acid elevated to more than 3, elevated white count -Admit to medical floor -Telemonitoring -IV Zosyn -Follow blood culture and urine culture. -Follow up white count  2. Leukocytosis secondary to #1  3. Down syndrome/questionable cerebral palsy -Patient is at high risk of aspiration is on pured nectar thick liquids -Father takes care of him at home. He has aides come in several times a week to help -Care management for discharge planning  4. History of hypertension -Hold meds for now given sepsis resume once the pressures remain stable  5. DVT prophylaxis subcutaneous Lovenox All the records are reviewed and case discussed with ED provider. Management plans discussed with the patient, family and they are in agreement.  CODE  STATUS: DO NOT RESUSCITATE discussed with father who is health Carlos [name] Atty.  TOTAL TIME TAKING CARE OF THIS PATIENT: 55 minutes.    Ciaira Natividad M.D on 01/04/2017 at 3:12 PM  Between 7am to 6pm - Pager - [phone]  After 6pm go to [email or web address] - password [email or web address] [facility]  [facility]  Office  [phone]  CC: Primary care physician; Carlos Desanctis, MD

## 2017-01-04 NOTE — ED Notes (Signed)
Pt cleaned up, fresh clean linens on bed and gown put on. Pt repositioned in the bed.

## 2017-01-04 NOTE — ED Notes (Signed)
Family update on pt's new room assignment. Family at bedside. Pt resting comfortably

## 2017-01-05 LAB — LACTIC ACID, PLASMA: Lactic Acid, Venous: 1 mmol/L (ref 0.5–1.9)

## 2017-01-05 MED ORDER — IRBESARTAN 150 MG PO TABS: 75.0000 mg | ORAL_TABLET | Freq: Every day | ORAL | Status: DC

## 2017-01-05 MED ORDER — IRBESARTAN 150 MG PO TABS
150.0000 mg | ORAL_TABLET | Freq: Every day | ORAL | Status: DC
  Administered 2017-01-06: 11:00:00 150 mg via ORAL
  Filled 2017-01-05: qty 1

## 2017-01-05 MED ORDER — SPIRONOLACTONE 25 MG PO TABS
25.0000 mg | ORAL_TABLET | Freq: Every day | ORAL | Status: DC
  Administered 2017-01-05 – 2017-01-06 (×2): 25 mg via ORAL
  Filled 2017-01-05 (×2): qty 1

## 2017-01-05 MED ORDER — IRBESARTAN 150 MG PO TABS
75.0000 mg | ORAL_TABLET | Freq: Every day | ORAL | Status: DC
  Administered 2017-01-05: 23:00:00 75 mg via ORAL
  Filled 2017-01-05: qty 1

## 2017-01-05 NOTE — Evaluation (Signed)
Clinical/Bedside Swallow Evaluation Patient Details  Name: Carlos Price MRN: [id number] Date of Birth: 10/30/1955  Today's Date: 01/05/2017 Time: SLP Start Time (ACUTE ONLY): 4098 SLP Stop Time (ACUTE ONLY): 1005 SLP Time Calculation (min) (ACUTE ONLY): 60 min  Past Medical History:  Past Medical History:  Diagnosis Date  . Down syndrome    more likley CP  . Hypertension   . Ulcerative colitis [facility])    Past Surgical History:  Past Surgical History:  Procedure Laterality Date  . none     HPI:  Pt  is a 61 y.o. male with a known history of UTIs, fever w/ N/V, Down syndrome, hypertension, history of ulcerative colitis comes to the emergency room with fever of 103.9 and vomiting. Patient's workup in the emergency room showed elevated white count and elevated lactic acid tachycardia with heart rate in the 130s and fever while 3.9. He was found to have UTI. Chest x-ray negative. He received IV vancomycin and Zosyn. his father who is his primary caregiver due to his cerebral palsy and down syndrome, per chart notes. Pt has a baseline of oropharyngeal phase dysphagia at home (several years per report) and is on a dysphagia level 1(puree) w/ thickened liquids - between nectar and honey consistency per description.    Assessment / Plan / Recommendation Clinical Impression  Pt appears to present w/ moderate oropharyngeal phase dysphagia w/ a significantly slower oral phase c/b oral holding and slow A-P transfer for swallowing; suspect a delayed swallow response/completion w/ trials of puree and Nectar consistency liquids fed to him via Cup. Pt required verbal cues and encouragement during feeding of trials; he did help to hold cup when drinking to aid stimulation and awareness of task. Pt appears at increased risk for aspiration sec. to his baseline presentation including cerebral palsy and down syndrome and its impact on the oropharyngeal phases of swallowing. He appears to be tolerating his  baseline diet of Dys. 1 w/ Nectar consistency liquids w/ no immediate, overt s/s of aspiration. Pt requires feeding w/ verbal/tactile cues to direct attention to task and follow through w/ accepting boluses. Discussed w/ Father during session (lengthy discussion) for education on aspiration precautions, diet consistency and food prep, and feeding strategies - handouts on precautions; food prep/consistency given. Discussed preparation of pureed foods using blender at home; suggestion for easy foods to prepare as well as use of Nectar consistency liquids at this time d/t pt's toleration of such during trials/session as well as the fact that it might aid in pt's hydration needs - pt does suffer from frquent UTIs per Father. Father stated he had been pureeing foods at home since the information given by SLP last few admission. Suggested to him to f/u w/ ST services for continued education on the toleration of diet via HH ST.  SLP Visit Diagnosis: Dysphagia, oropharyngeal phase (R13.12)    Aspiration Risk  Moderate aspiration risk    Diet Recommendation  Dysphagia level 1 (puree) w/ Nectar consistency liquids via Cup; strict aspiration precautions; reflux precautions  Medication Administration: Crushed with puree    Other  Recommendations Recommended Consults:  (Dietician and GI consults for management as needed) Oral Care Recommendations: Oral care BID;Staff/trained caregiver to provide oral care Other Recommendations: Order thickener from pharmacy;Prohibited food (jello, ice cream, thin soups);Remove water pitcher;Have oral suction available   Follow up Recommendations Home health SLP (TBD)      Frequency and Duration min 2x/week  1 week       Prognosis Prognosis  for Safe Diet Advancement: Fair Barriers to Reach Goals: Cognitive deficits;Severity of deficits (baseline)      Swallow Study   General Date of Onset: 01/04/17 HPI: Pt  is a 61 y.o. male with a known history of UTIs, fever w/ N/V,  Down syndrome, hypertension, history of ulcerative colitis comes to the emergency room with fever of 103.9 and vomiting. Patient's workup in the emergency room showed elevated white count and elevated lactic acid tachycardia with heart rate in the 130s and fever while 3.9. He was found to have UTI. Chest x-ray negative. He received IV vancomycin and Zosyn. his father who is his primary caregiver due to his cerebral palsy and down syndrome, per chart notes. Pt has a baseline of oropharyngeal phase dysphagia at home (several years per report) and is on a dysphagia level 1(puree) w/ thickened liquids - between nectar and honey consistency per description.  Type of Study: Bedside Swallow Evaluation Previous Swallow Assessment: 2016, 2017 Diet Prior to this Study: Dysphagia 1 (puree);Honey-thick liquids (vs Nectar consistency per description) Temperature Spikes Noted: Yes (at admission;  wbc 11.2) Respiratory Status: Nasal cannula (2 liters) History of Recent Intubation: No Behavior/Cognition: Alert;Cooperative;Pleasant mood;Distractible;Requires cueing;Doesn't follow directions (declined Cognition baseline) Oral Cavity Assessment: Within Functional Limits Oral Care Completed by SLP: Recent completion by staff Oral Cavity - Dentition: Adequate natural dentition Vision:  (n/a) Self-Feeding Abilities: Total assist Patient Positioning: Upright in bed Baseline Vocal Quality: Normal (during mumbled speech) Volitional Cough: Cognitively unable to elicit Volitional Swallow: Unable to elicit    Oral/Motor/Sensory Function Overall Oral Motor/Sensory Function:  (during bolus management appeared wfl)   Ice Chips Ice chips: Not tested   Thin Liquid Thin Liquid: Not tested    Nectar Thick Nectar Thick Liquid: Impaired Presentation: Cup;Self Fed (assisted; 8 trials) Oral Phase Impairments: Poor awareness of bolus Oral phase functional implications: Oral holding;Prolonged oral transit Pharyngeal Phase  Impairments:  (none noted)   Honey Thick Honey Thick Liquid: Not tested   Puree Puree: Impaired Presentation: Spoon (fed; 5 trials) Oral Phase Impairments: Poor awareness of bolus Oral Phase Functional Implications: Prolonged oral transit;Oral holding Pharyngeal Phase Impairments:  (none noted)   Solid   GO   Solid: Not tested         Jerilynn SomKatherine Watson, MS, CCC-SLP Watson,Katherine 01/05/2017,12:54 PM

## 2017-01-05 NOTE — Progress Notes (Signed)
[facility] - [place] at [facility]   PATIENT NAME: Carlos Price    MR#:  [id number]  DATE OF BIRTH:  July 01, 1956  SUBJECTIVE:  Pt much awake and alert. Saying a few words at a time  REVIEW OF SYSTEMS:   Review of Systems  Unable to perform ROS: Medical condition   Tolerating Diet: Tolerating PT:   DRUG ALLERGIES:  No Known Allergies  VITALS:  Blood pressure (!) 120/57, pulse 87, temperature 99 F (37.2 C), temperature source Oral, resp. rate (!) 21, height 4\' 9"  (1.448 m), weight 49.9 kg (110 lb), SpO2 95 %.  PHYSICAL EXAMINATION:   Physical Exam  GENERAL:  61 y.o.-year-old patient lying in the bed with no acute distress. Downs features EYES: Pupils equal, round, reactive to light and accommodation. No scleral icterus. Extraocular muscles intact.  HEENT: Head atraumatic, normocephalic. Oropharynx and nasopharynx clear.  NECK:  Supple, no jugular venous distention. No thyroid enlargement, no tenderness.  LUNGS: Normal breath sounds bilaterally, no wheezing, rales, rhonchi. No use of accessory muscles of respiration.  CARDIOVASCULAR: S1, S2 normal. No murmurs, rubs, or gallops.  ABDOMEN: Soft, nontender, nondistended. Bowel sounds present. No organomegaly or mass.  EXTREMITIES: No cyanosis, clubbing or edema b/l.    NEUROLOGIC:moves all extremities well. PSYCHIATRIC:  patient is alert and awake. At baseline does not communicate much SKIN: No obvious rash, lesion, or ulcer.   LABORATORY PANEL:  CBC  Recent Labs Lab 01/04/17 1217  WBC 11.2*  HGB 13.3  HCT 38.7*  PLT 118*    Chemistries   Recent Labs Lab 01/04/17 1217  NA 139  K 3.6  CL 103  CO2 25  GLUCOSE 112*  BUN 16  CREATININE 0.79  CALCIUM 8.3*  AST 63*  ALT 40  ALKPHOS 116  BILITOT 0.8   Cardiac Enzymes No results for input(s): TROPONINI in the last 168 hours. RADIOLOGY:  Dg Chest Port 1 View  Result Date: 01/04/2017 CLINICAL DATA:  Fever and vomiting EXAM:  PORTABLE CHEST 1 VIEW COMPARISON:  March 02, 2016 FINDINGS: There is no edema or consolidation. Heart size and pulmonary vascularity are normal. No adenopathy. No evident bone lesions. IMPRESSION: No edema or consolidation. Electronically Signed   By: Bretta BangWilliam  Woodruff III M.D.   On: 01/04/2017 12:24   ASSESSMENT AND PLAN:  Carlos Price  is a 61 y.o. male with a known history of Down syndrome, hypertension, history of ulcerative colitis comes to the emergency room with fever of 103.9 and vomiting.  1. Sepsis due to UTI -Came in with fever 103.9, tachycardia, lactic acid elevated to more than 3--6.7--1.0 - elevated white count 11.2 -IV Zosyn - blood culture neg  and urine culture pending  2. Leukocytosis secondary to #1  3. Down syndrome/questionable cerebral palsy -Patient is at high risk of aspiration is on pured nectar thick liquids---speech to see -Father takes care of him at home. He has aides come in several times a week to help -Care management for discharge planning  4. History of hypertension -Hold meds for now given sepsis resume once the pressures remain stable  5. DVT prophylaxis subcutaneous Lovenox  Overall improving and much interactive today  Case discussed with Care Management/Social Worker. Management plans discussed with the patient, family and they are in agreement.  CODE STATUS: DNR  DVT Prophylaxis: lovenox  TOTAL TIME TAKING CARE OF THIS PATIENT: 30 minutes.  >50% time spent on counselling and coordination of care  POSSIBLE D/C IN 1-2 DAYS, DEPENDING ON  CLINICAL CONDITION.  Note: This dictation was prepared with Dragon dictation along with smaller phrase technology. Any transcriptional errors that result from this process are unintentional.  Aricka Goldberger M.D on 01/05/2017 at 8:17 AM  Between 7am to 6pm - Pager - [phone]  After 6pm go to [email or web address] - Social research officer, government  [facility]  Office  [phone]  CC: Primary  care physician; Orene Desanctis, MD

## 2017-01-05 NOTE — Care Management (Signed)
Admitted to [facility] with the diagnosis of sepsis. Lives with father, Fayrene FearingJames, [phone]). Last seen Dr. Richardine ServiceBehling 4 months ago. Pres[facility] are [facility] at [facility] (personal care) are in the home 9:00am-12 Noon 5 days a week.  Private pay Wednesday and Thursday 4:00pm-8:00pm and then on Saturday & Sunday 5:00pm-8:pm. No skilled facility. No home oxygen, Wheelchair hospital bed, and bedside commode in the home. No falls. Father will transport per private car, Gwenette GreetBrenda S Tawn Fitzner RN MSN CCM Care Management [phone]

## 2017-01-06 MED ORDER — CEPHALEXIN 500 MG PO CAPS: 500.0000 mg | ORAL_CAPSULE | Freq: Two times a day (BID) | ORAL | Status: DC

## 2017-01-06 MED ORDER — CEPHALEXIN 500 MG PO CAPS: 500.0000 mg | ORAL_CAPSULE | Freq: Two times a day (BID) | ORAL | 0 refills | Status: AC

## 2017-01-06 NOTE — Discharge Summary (Signed)
[facility] - [place] at [facility]   PATIENT NAME: Carlos Price    MR#:  [id number]  DATE OF BIRTH:  07/14/1955  DATE OF ADMISSION:  01/04/2017 ADMITTING PHYSICIAN: Shaune PollackQing Chen, MD  DATE OF DISCHARGE: 01/06/2017  PRIMARY CARE PHYSICIAN: Orene DesanctisBehling, Karen, MD    ADMISSION DIAGNOSIS:  Acute cystitis without hematuria [N30.00] Sepsis, due to unspecified organism (HCC) [A41.9] Sepsis (HCC) [A41.9]  DISCHARGE DIAGNOSIS:  Sepsis on admission--resolved E coli UTI  SECONDARY DIAGNOSIS:   Past Medical History:  Diagnosis Date  . Down syndrome    more likley CP  . Hypertension   . Ulcerative colitis [facility](HCC)     HOSPITAL COURSE:   Carlos Maynardis a 61 y.o. malewith a known history of Down syndrome, hypertension, history of ulcerative colitis comes to the emergency room with fever of 103.9 and vomiting.  1. Sepsis due to UTI -Came in with fever 103.9, tachycardia, lactic acid elevated to more than 3--6.7--1.0 - elevated white count 11.2 -IV Zosyn---oral keflex - blood culture neg  and urine culture prelim GNR likely Ecoli  2. Leukocytosis secondary to #1  3. Down syndrome/questionable cerebral palsy -Patient is at high risk of aspiration is on pured nectar thick liquids---speech eval noted -Father takes care of him at home. He has aides come in several times a week to help -Care management for discharge planning  4. History of hypertension -resumed valsartan  5. DVT prophylaxis subcutaneous Lovenox  Overall at baseline D/c home Left message for father Carlos Price. CONSULTS OBTAINED:    DRUG ALLERGIES:  No Known Allergies  DISCHARGE MEDICATIONS:   Current Discharge Medication List    START taking these medications   Details  cephALEXin (KEFLEX) 500 MG capsule Take 1 capsule (500 mg total) by mouth every 12 (twelve) hours. Qty: 12 capsule, Refills: 0      CONTINUE these medications which have NOT CHANGED   Details  famotidine  (PEPCID) 20 MG tablet Take 20 mg by mouth 2 (two) times daily.    mesalamine (PENTASA) 500 MG CR capsule Take 2,000 mg by mouth 2 (two) times daily.    selenium sulfide (SELSUN) 2.5 % shampoo Apply 1 application topically once a week. Thursday    spironolactone (ALDACTONE) 5 mg/mL SUSP oral suspension Take 25 mg by mouth daily.    Wheat Dextrin (BENEFIBER PO) Take 10 mLs by mouth 3 (three) times daily.        If you experience worsening of your admission symptoms, develop shortness of breath, life threatening emergency, suicidal or homicidal thoughts you must seek medical attention immediately by calling 911 or calling your MD immediately  if symptoms less severe.  You Must read complete instructions/literature along with all the possible adverse reactions/side effects for all the Medicines you take and that have been prescribed to you. Take any new Medicines after you have completely understood and accept all the possible adverse reactions/side effects.   Please note  You were cared for by a hospitalist during your hospital stay. If you have any questions about your discharge medications or the care you received while you were in the hospital after you are discharged, you can call the unit and asked to speak with the hospitalist on call if the hospitalist that took care of you is not available. Once you are discharged, your primary care physician will handle any further medical issues. Please note that NO REFILLS for any discharge medications will be authorized once you are discharged, as it is imperative that  you return to your primary care physician (or establish a relationship with a primary care physician if you do not have one) for your aftercare needs so that they can reassess your need for medications and monitor your lab values. Today   SUBJECTIVE   Awake and watching his favorite show '[employer]'!!  VITAL SIGNS:  Blood pressure (!) 156/85, pulse 79, temperature 98.4 F (36.9 C),  temperature source Oral, resp. rate 16, height 4\' 9"  (1.448 m), weight 49.9 kg (110 lb), SpO2 93 %.  I/O:   Intake/Output Summary (Last 24 hours) at 01/06/17 0805 Last data filed at 01/05/17 1844  Gross per 24 hour  Intake              420 ml  Output              250 ml  Net              170 ml    PHYSICAL EXAMINATION:  GENERAL:  61 y.o.-year-old patient lying in the bed with no acute distress.  EYES: Pupils equal, round, reactive to light and accommodation. No scleral icterus. Extraocular muscles intact. Downs features HEENT: Head atraumatic, normocephalic. Oropharynx and nasopharynx clear.  NECK:  Supple, no jugular venous distention. No thyroid enlargement, no tenderness.  LUNGS: Normal breath sounds bilaterally, no wheezing, rales,rhonchi or crepitation. No use of accessory muscles of respiration.  CARDIOVASCULAR: S1, S2 normal. No murmurs, rubs, or gallops.  ABDOMEN: Soft, non-tender, non-distended. Bowel sounds present. No organomegaly or mass.  EXTREMITIES: No pedal edema, cyanosis, or clubbing. Some contractures NEUROLOGIC: downs' features. Moves extremities well. Limited PSYCHIATRIC: The patient is alert and awake.  SKIN: No obvious rash, lesion, or ulcer.   DATA REVIEW:   CBC   Recent Labs Lab 01/04/17 1217  WBC 11.2*  HGB 13.3  HCT 38.7*  PLT 118*    Chemistries   Recent Labs Lab 01/04/17 1217  NA 139  K 3.6  CL 103  CO2 25  GLUCOSE 112*  BUN 16  CREATININE 0.79  CALCIUM 8.3*  AST 63*  ALT 40  ALKPHOS 116  BILITOT 0.8    Microbiology Results   Recent Results (from the past 240 hour(s))  Blood Culture (routine x 2)     Status: None (Preliminary result)   Collection Time: 01/04/17 12:17 PM  Result Value Ref Range Status   Specimen Description BLOOD BLOOD LEFT FOREARM  Final   Special Requests Blood Culture adequate volume  Final   Culture NO GROWTH 2 DAYS  Final   Report Status PENDING  Incomplete  Blood Culture (routine x 2)     Status:  None (Preliminary result)   Collection Time: 01/04/17 12:17 PM  Result Value Ref Range Status   Specimen Description BLOOD BLOOD RIGHT FOREARM  Final   Special Requests Blood Culture adequate volume  Final   Culture NO GROWTH 2 DAYS  Final   Report Status PENDING  Incomplete  Urine culture     Status: Abnormal (Preliminary result)   Collection Time: 01/04/17  1:16 PM  Result Value Ref Range Status   Specimen Description URINE, RANDOM  Final   Special Requests NONE  Final   Culture (A)  Final    >=100,000 COLONIES/mL GRAM NEGATIVE RODS IDENTIFICATION AND SUSCEPTIBILITIES TO FOLLOW Performed at [facility], [address], [place], [place] [address]    Report Status PENDING  Incomplete    RADIOLOGY:  Dg Chest Port 1 View  Result Date: 01/04/2017  CLINICAL DATA:  Fever and vomiting EXAM: PORTABLE CHEST 1 VIEW COMPARISON:  March 02, 2016 FINDINGS: There is no edema or consolidation. Heart size and pulmonary vascularity are normal. No adenopathy. No evident bone lesions. IMPRESSION: No edema or consolidation. Electronically Signed   By: Bretta Bang III M.D.   On: 01/04/2017 12:24     Management plans discussed with the patient, family and they are in agreement.  CODE STATUS:     Code Status Orders        Start     Ordered   01/04/17 1855  Do not attempt resuscitation (DNR)  Continuous    Question Answer Comment  In the event of cardiac or respiratory ARREST Do not call a "code blue"   In the event of cardiac or respiratory ARREST Do not perform Intubation, CPR, defibrillation or ACLS   In the event of cardiac or respiratory ARREST Use medication by any route, position, wound care, and other measures to relive pain and suffering. May use oxygen, suction and manual treatment of airway obstruction as needed for comfort.      01/04/17 1854    Code Status History    Date Active Date Inactive Code Status Order ID Comments User Context   01/07/2016  9:50 PM 01/10/2016  3:21  PM DNR [id number]  Gery Pray, MD Inpatient   06/10/2015  2:11 AM 06/11/2015  5:56 PM Full Code [id number]  Arnaldo Natal, MD Inpatient    Advance Directive Documentation     Most Recent Value  Type of Advance Directive  Healthcare Power of Attorney  Pre-existing out of facility DNR order (yellow form or pink MOST form)  -  "MOST" Form in Place?  -      TOTAL TIME TAKING CARE OF THIS PATIENT: 40 minutes.    Kenidy Crossland M.D on 01/06/2017 at 8:05 AM  Between 7am to 6pm - Pager - [phone] After 6pm go to [email or web address] - Social research officer, government  [facility]  Office  [phone]  CC: Primary care physician; Orene Desanctis, MD

## 2017-01-06 NOTE — Progress Notes (Signed)
Speech Language Pathology Treatment: Dysphagia  Patient Details Name: Carlos Price MRN: [id number] DOB: 1956-06-07 Today's Date: 01/06/2017 Time: 4098-1191 SLP Time Calculation (min) (ACUTE ONLY): 35 min  Assessment / Plan / Recommendation Clinical Impression  Pt appears to present w/ moderate oropharyngeal phase dysphagia w/ a significantly slower oral phase c/b oral holding and slow A-P transfer for swallowing; suspect a delayed swallow response/completion w/ trials of puree and Nectar consistency liquids fed to him via Cup, tsp. Pt requires verbal cues and encouragement during feeding of trials; he can help to hold cup when drinking which can aid stimulation and awareness of task. Pt appears at increased risk for aspiration sec. to his baseline presentation including cerebral palsy and down syndrome and its impact on the oropharyngeal phases of swallowing. He does appear to be tolerating his baseline diet of Dys. 1 (puree) and the Nectar consistency liquids w/ no immediate, overt s/s of aspiration. Pt requires feeding support w/ verbal/tactile cues to direct attention to task and follow through w/ accepting boluses d/t the oral phase deficits c/b lengthy time for A-P transfer at times. Discussed and gave education on aspiration precautions, diet/liquid consistency; feeding strategies; food prep/consistency given. Discussed preparation of pureed foods using blender at home; suggestion for easy foods to prepare as well as use of Nectar consistency liquids at this time d/t pt's toleration of such during trials/session as well as the fact that it might aid in pt's hydration needs - pt does suffer from frequent UTIs per Father. Recommended strict aspiration precautions; Pills in Puree - Crushed. Father stated he had been pureeing foods at home since the information given by SLP last few admission. Suggested to him to f/u w/ ST services for continued education on the toleration of diet via [name] if desired.  Father agreed. Pt appears at/close to his baseline. NSG updated.      HPI HPI: Pt  is a 61 y.o. male with a known history of UTIs, fever w/ N/V, Down syndrome, hypertension, history of ulcerative colitis comes to the emergency room with fever of 103.9 and vomiting. Patient's workup in the emergency room showed elevated white count and elevated lactic acid tachycardia with heart rate in the 130s and fever while 3.9. He was found to have UTI. Chest x-ray negative. He received IV vancomycin and Zosyn. his father who is his primary caregiver due to his cerebral palsy and down syndrome, per chart notes. Pt has a baseline of oropharyngeal phase dysphagia at home (several years per report) and is on a dysphagia level 1(puree) w/ thickened liquids - between nectar and honey consistency per description. Pt has been tolerating the currently recommended diet of Dysphagia 1(puree) w/ Nectar liquids per Father.      SLP Plan  All goals met; pt appears at/close to his baseline as per Father's report       Recommendations  Diet recommendations: Dysphagia 1 (puree);Nectar-thick liquid (w/ Father monitoring need for Honey consistency liquids) Liquids provided via: Cup;Teaspoon Medication Administration: Crushed with puree Supervision: Full supervision/cueing for compensatory strategies;Trained caregiver to feed patient;Staff to assist with self feeding Compensations: Minimize environmental distractions;Slow rate;Small sips/bites;Lingual sweep for clearance of pocketing;Multiple dry swallows after each bite/sip;Follow solids with liquid Postural Changes and/or Swallow Maneuvers: Seated upright 90 degrees;Upright 30-60 min after meal                General recommendations:  (dietician f/u for education; supplements) Oral Care Recommendations: Oral care BID;Staff/trained caregiver to provide oral care Follow up Recommendations:  Home health SLP (TBD) SLP Visit Diagnosis: Dysphagia, oropharyngeal phase  (R13.12) Plan: All goals met       GO               Orinda Kenner, MS, CCC-SLP Carlos Price 01/06/2017, 6:26 PM

## 2017-01-06 NOTE — Care Management (Signed)
Discharge to home today per Dr. Enedina FinnerSona Patel. No follow-up needs identified. Father will transport Carlos GreetBrenda S Fianna Snowball RN MSN CCM Care Management [phone]

## 2017-01-07 ENCOUNTER — Telehealth: Payer: Self-pay | Admitting: Pharmacist

## 2017-01-07 LAB — URINE CULTURE

## 2017-01-07 NOTE — Telephone Encounter (Signed)
Ucx resulted w/ ESBL Ecoli. Pt was d/c on keflex. Called and spoke w/ pt father/caregiver. Confirmed no drug allergies. Let him know that the bacteria was resistant to keflex. Can stop taking keflex. [facility]. Informed him I would call in bactrim BID x 10 days for patient. Requested suspension. Called in bactrim suspension 20 ML (800mg  of sulfa) BID X 10 days. Authorized by Dr. Enedina FinnerSona Patel.  Olene FlossMelissa D Vahan Wadsworth, Pharm.D, BCPS Clinical Pharmacist

## 2017-01-09 LAB — CULTURE, BLOOD (ROUTINE X 2)
Culture: NO GROWTH
Culture: NO GROWTH
SPECIAL REQUESTS: ADEQUATE
Special Requests: ADEQUATE

## 2017-07-14 ENCOUNTER — Other Ambulatory Visit: Payer: Self-pay | Admitting: Pediatrics

## 2017-07-14 DIAGNOSIS — R1312 Dysphagia, oropharyngeal phase: Secondary | ICD-10-CM

## 2017-08-10 ENCOUNTER — Ambulatory Visit: Payer: Medicare Other

## 2017-08-11 ENCOUNTER — Ambulatory Visit
Admission: RE | Admit: 2017-08-11 | Discharge: 2017-08-11 | Disposition: A | Discharge status: Home / Self Care | Source: Ambulatory Visit | Attending: Pediatrics | Admitting: Pediatrics

## 2017-08-11 DIAGNOSIS — T17908D Unspecified foreign body in respiratory tract, part unspecified causing other injury, subsequent encounter: Secondary | ICD-10-CM | POA: Diagnosis present

## 2017-08-11 DIAGNOSIS — X58XXXS Exposure to other specified factors, sequela: Secondary | ICD-10-CM | POA: Diagnosis not present

## 2017-08-11 DIAGNOSIS — R1312 Dysphagia, oropharyngeal phase: Secondary | ICD-10-CM | POA: Diagnosis not present

## 2017-08-11 DIAGNOSIS — T17310S Gastric contents in larynx causing asphyxiation, sequela: Secondary | ICD-10-CM | POA: Insufficient documentation

## 2017-08-11 DIAGNOSIS — X58XXXD Exposure to other specified factors, subsequent encounter: Secondary | ICD-10-CM | POA: Insufficient documentation

## 2017-08-11 NOTE — Therapy (Addendum)
[facility] DIAGNOSTIC RADIOLOGY [address] [place], [place], [address] Phone: [phone]   Fax:     Modified Barium Swallow  Patient Details  Name: Carlos LabJimmie Bogdan MRN: [id number] Date of Birth: 09-19-1955 No Data Recorded  Encounter Date: 08/11/2017  End of Session - 08/11/17 1523    Visit Number  1    Number of Visits  1    Date for SLP Re-Evaluation  08/11/17    SLP Start Time  1300    SLP Stop Time   1430    SLP Time Calculation (min)  90 min    Activity Tolerance  Patient tolerated treatment well       Past Medical History:  Diagnosis Date  . Down syndrome    more likley CP  . Hypertension   . Ulcerative colitis [facility](HCC)     Past Surgical History:  Procedure Laterality Date  . none      There were no vitals filed for this visit.      Subjective: Patient behavior: (alertness, ability to follow instructions, etc.): pt awake, mostly nonverbal but nodded 2x when Father addressed him. Congested cough noted. Pt has baseline Down Syndrome, Cerebral Palsy. Father has noted pt becomes "sleepy" during meals when given some of his medications w/ breakfast meal; Father has also noted more "sluggish" behavior and less talking overall. MD concerned for "advancing neurological disease process" per notes. Chief complaint: dysphagia. Pt has a baseline of oropharyngeal phase dysphagia and is currently on a Dysphagia level 1(puree) diet w/ Honey consistency liquids at home. He requires total care from his Father and Caregivers including feeding assistance. Father stated they are receiving Hospice/Palliative services.   Objective:  Radiological Procedure: A videoflouroscopic evaluation of oral-preparatory, reflex initiation, and pharyngeal phases of the swallow was performed; as well as a screening of the upper esophageal phase.  I. POSTURE: upright II. VIEW: lateral III. COMPENSATORY STRATEGIES: tactile/verbal cues to the neck to encourage  swallowing; alternating food/liquid trials IV. BOLUSES ADMINISTERED:  Thin Liquid: NT  Nectar-thick Liquid: NT  Honey-thick Liquid: 3 tsps (1/2 size)  Puree: 2 trials (1/2 size)  Mechanical Soft: V. RESULTS OF EVALUATION: A. ORAL PREPARATORY PHASE: (The lips, tongue, and velum are observed for strength and coordination)       **Overall Severity Rating: MODERATE+. Pt exhibited slow oral phase bolus management c/b oral munching/mashing and lingual pumping movements for posterior transfer, delayed A-P transfer. Noted reduced coordination w/ premature spillage of all trial consistencies. Oral residue remained but reduced w/ f/u swallows and alternating food/liquid by TSP. Slight open mouth posture at rest.   B. SWALLOW INITIATION/REFLEX: (The reflex is normal if "triggered" by the time the bolus reached the base of the tongue)  **Overall Severity Rating: MODERATE+. Delayed pharyngeal swallow initiation noted w/ all consistencies assessed - trials spilled into/filled the Pyriform Sinuses prior to swallow initiation before complete airway closure noted. Laryngeal penetration noted x1. This reduced airway closure can increase airway compromise during swallowing.   C. PHARYNGEAL PHASE: (Pharyngeal function is normal if the bolus shows rapid, smooth, and continuous transit through the pharynx and there is no pharyngeal residue after the swallow)  **Overall Severity Rating: MILD+. Min bolus residue was noted around/on the valleculae - bolus amount did not appear to increase w/ further trials.  D. LARYNGEAL PENETRATION: (Material entering into the laryngeal inlet/vestibule but not aspirated): x2-3 from pharyngeal residue  E. ASPIRATION: x1 following the laryngeal penetration F. ESOPHAGEAL PHASE: (Screening of the upper  esophagus): no gross deficits noted in the cervical Esophagus viewable  ASSESSMENT: Pt appears to present w/ Moderate+ oropharyngeal phase dysphagia w/ increased risk for airway compromise  from laryngeal penetration and aspiration during swallowing thus thus risk for impact on his Pulmonary status. Suspect pt's baseline medical and neurological status' are directly related to his dysphagia. Pt has been on a modified, dysphagia diet w/ aspiration precautions and requires full assistance. During the Oral phase, pt exhibited slow oral bolus management c/b oral munching/mashing and lingual pumping movements for posterior transfer, delayed A-P transfer. Noted reduced coordination w/ premature spillage w/ all trial consistencies. Oral residue remained but reduced w/ f/u swallows and alternating food/liquid by TSP. Slight open mouth posture at rest. During the Pharyngeal phase, significantly delayed pharyngeal swallow initiation noted w/ all consistencies assessed - trials spilled into/filled the Pyriform Sinuses prior to swallow initiation before complete airway closure noted. Laryngeal penetration noted x1. This reduced airway closure can increase airway compromise during swallowing. Min bolus residue was noted around/on the valleculae - bolus amount did not appear to significantly increase w/ further trials. Laryngeal penetration and aspiration resulted; Cough reflex noted.   PLAN/RECOMMENDATIONS:  A. Diet: continue current diet of Dysphagia level 1(puree) w/ HONEY consistency liquids - all po's by TSP in small amounts  B. Swallowing Precautions: TSP size boluses including w/ the liquid; time for a f/u, dry swallow b/t trials; alternate food/liquid boluses to aid clearing   C. Recommended consultation to: f/u w/ PCP for prognosis and nutritional support options; f/u w/ Hospice services for guidance and support  D. Therapy recommendations: none at this time; education given today, handouts  E. Results and recommendations were discussed w/ Father; video reviewed and questions answered.          Oropharyngeal dysphagia - Plan: DG OP Swallowing Func-Medicare/Speech Path, DG OP Swallowing  Func-Medicare/Speech Path  G-Codes - 09/10/2017 1524    Functional Assessment Tool Used  clinical judgement    Functional Limitations  Swallowing    Swallow Current Status (X9147)  At least 60 percent but less than 80 percent impaired, limited or restricted    Swallow Goal Status (W2956)  At least 60 percent but less than 80 percent impaired, limited or restricted    Swallow Discharge Status [phone])  At least 60 percent but less than 80 percent impaired, limited or restricted           Problem List Patient Active Problem List   Diagnosis Date Noted  . Sepsis (HCC) 01/04/2017  . Ulcerative colitis (HCC) 01/07/2016  . Dysphagia 01/07/2016  . UTI (lower urinary tract infection) 06/10/2015      Jerilynn Som, MS, CCC-SLP Watson,Katherine September 10, 2017, 3:24 PM   [facility] DIAGNOSTIC RADIOLOGY [address] [place], [place], [address] Phone: [phone]   Fax:     Name: Giovannie Scerbo MRN: [id number] Date of Birth: 06/29/1956

## 2017-09-23 ENCOUNTER — Emergency Department

## 2017-09-23 ENCOUNTER — Emergency Department
Admission: EM | Admit: 2017-09-23 | Discharge: 2017-09-23 | Disposition: A | Discharge status: Home / Self Care | Attending: Emergency Medicine | Admitting: Emergency Medicine

## 2017-09-23 ENCOUNTER — Encounter: Payer: Self-pay | Admitting: Emergency Medicine

## 2017-09-23 DIAGNOSIS — I10 Essential (primary) hypertension: Secondary | ICD-10-CM | POA: Diagnosis not present

## 2017-09-23 DIAGNOSIS — Y929 Unspecified place or not applicable: Secondary | ICD-10-CM | POA: Diagnosis not present

## 2017-09-23 DIAGNOSIS — S0282XA Fracture of other specified skull and facial bones, left side, initial encounter for closed fracture: Secondary | ICD-10-CM | POA: Insufficient documentation

## 2017-09-23 DIAGNOSIS — Y999 Unspecified external cause status: Secondary | ICD-10-CM | POA: Insufficient documentation

## 2017-09-23 DIAGNOSIS — S0181XA Laceration without foreign body of other part of head, initial encounter: Secondary | ICD-10-CM

## 2017-09-23 DIAGNOSIS — W19XXXA Unspecified fall, initial encounter: Secondary | ICD-10-CM | POA: Diagnosis not present

## 2017-09-23 DIAGNOSIS — Q909 Down syndrome, unspecified: Secondary | ICD-10-CM | POA: Diagnosis not present

## 2017-09-23 DIAGNOSIS — S0280XA Fracture of other specified skull and facial bones, unspecified side, initial encounter for closed fracture: Secondary | ICD-10-CM

## 2017-09-23 DIAGNOSIS — S0993XA Unspecified injury of face, initial encounter: Secondary | ICD-10-CM | POA: Diagnosis present

## 2017-09-23 DIAGNOSIS — S01412A Laceration without foreign body of left cheek and temporomandibular area, initial encounter: Secondary | ICD-10-CM | POA: Diagnosis not present

## 2017-09-23 DIAGNOSIS — S022XXA Fracture of nasal bones, initial encounter for closed fracture: Secondary | ICD-10-CM | POA: Diagnosis not present

## 2017-09-23 DIAGNOSIS — Z79899 Other long term (current) drug therapy: Secondary | ICD-10-CM | POA: Diagnosis not present

## 2017-09-23 DIAGNOSIS — Y939 Activity, unspecified: Secondary | ICD-10-CM | POA: Insufficient documentation

## 2017-09-23 DIAGNOSIS — S0285XA Fracture of orbit, unspecified, initial encounter for closed fracture: Secondary | ICD-10-CM

## 2017-09-23 MED ORDER — CEPHALEXIN 250 MG/5ML PO SUSR: 500.0000 mg | Freq: Two times a day (BID) | ORAL | 0 refills | Status: AC

## 2017-09-23 MED ORDER — LIDOCAINE HCL (PF) 1 % IJ SOLN
INTRAMUSCULAR | Status: AC
  Administered 2017-09-23: 02:00:00
  Filled 2017-09-23: qty 5

## 2017-09-23 NOTE — ED Provider Notes (Signed)
[facility] Emergency Department Provider Note    First MD Initiated Contact with Patient 09/23/17 0034     (approximate)  I have reviewed the triage vital signs and the nursing notes.  Level 5 caveat: History limited secondary to patient's nonverbal HISTORY  Chief Complaint Fall    HPI Carlos Price is a 62 y.o. male with below list of chronic medical conditions including Down syndrome hypertension and ulcerative colitis presents to the emergency department with history of accidental fall with facial injury.  Per EMS patient witnessed fall by his father secondary to slip and fall   Past Medical History:  Diagnosis Date  . Down syndrome    more likley CP  . Hypertension   . Ulcerative colitis [facility])     Patient Active Problem List   Diagnosis Date Noted  . Sepsis (HCC) 01/04/2017  . Ulcerative colitis (HCC) 01/07/2016  . Dysphagia 01/07/2016  . UTI (lower urinary tract infection) 06/10/2015    Past Surgical History:  Procedure Laterality Date  . none      Prior to Admission medications   Medication Sig Start Date End Date Taking? Authorizing Provider  cephALEXin (KEFLEX) 250 MG/5ML suspension Take 10 mLs (500 mg total) by mouth 2 (two) times daily for 7 days. 09/23/17 09/30/17  Darci Current, MD  cephALEXin (KEFLEX) 500 MG capsule Take 1 capsule (500 mg total) by mouth every 12 (twelve) hours. 01/07/17   Enedina Finner, MD  famotidine (PEPCID) 20 MG tablet Take 20 mg by mouth 2 (two) times daily.    [provider]  mesalamine (PENTASA) 500 MG CR capsule Take 2,000 mg by mouth 2 (two) times daily.    [provider]  selenium sulfide (SELSUN) 2.5 % shampoo Apply 1 application topically once a week. Thursday    [provider]  spironolactone (ALDACTONE) 5 mg/mL SUSP oral suspension Take 25 mg by mouth daily.    [provider]  Wheat Dextrin (BENEFIBER PO) Take 10 mLs by mouth 3 (three) times daily.     [provider]    Allergies No known drug allergies  Family History  Problem Relation Age of Onset  . Muscular dystrophy Mother     Social History Social History   Tobacco Use  . Smoking status: Never Smoker  . Smokeless tobacco: Never Used  Substance Use Topics  . Alcohol use: No  . Drug use: No    Review of Systems Constitutional: No fever/chills Eyes: No visual changes.  Positive for bruising around the left eye ENT: No sore throat. Cardiovascular: Denies chest pain. Respiratory: Denies shortness of breath. Gastrointestinal: No abdominal pain.  No nausea, no vomiting.  No diarrhea.  No constipation. Genitourinary: Negative for dysuria. Musculoskeletal: Negative for neck pain.  Negative for back pain. Integumentary: Negative for rash.  Positive for left facial laceration Neurological: Negative for headaches, focal weakness or numbness.   ____________________________________________   PHYSICAL EXAM:  VITAL SIGNS: ED Triage Vitals [09/23/17 0036]  Enc Vitals Group     BP      Pulse      Resp      Temp 98.5 F (36.9 C)     Temp Source Oral     SpO2      Weight 49.9 kg (110 lb)     Height      Head Circumference      Peak Flow      Pain Score      Pain Loc  Pain Edu?      Excl. in GC?     Constitutional: Alert and oriented. Well appearing and in no acute distress. Eyes: Conjunctivae are normal. PERRL.  Left periorbital ecchymoses and swelling  head: Left periorbital ecchymoses swelling. Nose: No congestion/rhinnorhea. Mouth/Throat: Mucous membranes are moist.  Oropharynx non-erythematous. Neck: No stridor.   Cardiovascular: Normal rate, regular rhythm. Good peripheral circulation. Grossly normal heart sounds. Respiratory: Normal respiratory effort.  No retractions. Lungs CTAB. Gastrointestinal: Soft and nontender. No distention.  Musculoskeletal: No lower extremity tenderness nor edema.  Deformity noted to the right  shoulder Neurologic:  Normal speech and language. No gross focal neurologic deficits are appreciated.  Skin:  Skin is warm, dry and intact. No rash noted.  1 inch linear superficial laceration inferior to the left eye   _______________________________________________________________________  RADIOLOGY I, Nara Visa N Jerlene Rockers, personally viewed and evaluated these images (plain radiographs) as part of my medical decision making, as well as reviewing the written report by the radiologist.  ED MD interpretation:    Official radiology report(s): Dg Shoulder Right  Result Date: 09/23/2017 CLINICAL DATA:  Right shoulder deformity. Patient fell out of bed tonight. EXAM: RIGHT SHOULDER - 2+ VIEW COMPARISON:  CXR from 03/02/2016 which includes the right shoulder. FINDINGS: Chronic appearing fracture deformity of the right proximal humerus with impacted appearing humeral neck on humeral head. No acute fracture or joint dislocation. The adjacent ribs and lung are nonacute. Bones are demineralized in appearance. IMPRESSION: Chronic healed fracture deformity of the proximal humerus. No acute osseous abnormality nor joint dislocation. Electronically Signed   By: Tollie Ethavid  Kwon M.D.   On: 09/23/2017 01:29   Ct Head Wo Contrast  Result Date: 09/23/2017 CLINICAL DATA:  Slip and fall with facial trauma. Laceration under left eye. Unable to assess nexus. Nonverbal patient. EXAM: CT HEAD WITHOUT CONTRAST CT MAXILLOFACIAL WITHOUT CONTRAST CT CERVICAL SPINE WITHOUT CONTRAST TECHNIQUE: Multidetector CT imaging of the head, cervical spine, and maxillofacial structures were performed using the standard protocol without intravenous contrast. Multiplanar CT image reconstructions of the cervical spine and maxillofacial structures were also generated. COMPARISON:  CT 09/07/2013 FINDINGS: CT HEAD FINDINGS Brain: Progressive atrophy, advanced for age. Progressive ventricular dilatation likely secondary to atrophy. Moderate chronic  small vessel ischemia. No intracranial hemorrhage or evidence of acute ischemia. No subdural or extra-axial fluid collection. Vascular: No hyperdense vessel or unexpected calcification. Skull: No fracture or focal lesion. Other: None. CT MAXILLOFACIAL FINDINGS Osseous: Minimally depressed left nasal bone fracture. No nasal septal hematoma. Zygomatic arches and mandibles are intact. The temporomandibular joints are congruent. Orbits: Minimally displaced fracture of the medial wall left orbit with herniation of retrobulbar fat. No entrapment. The globe is intact. Right orbit and globe are intact without acute fracture. Muscle enhanced Sinuses: Small left hemosinus. Chronic opacification of right maxillary sinus and mucous retention cysts in the left maxillary sinus. Opacification of left ethmoid air cells related to orbital fracture. No sinus fracture. Soft tissues: Left infraorbital soft tissue edema. Mild left paranasal soft tissue edema. CT CERVICAL SPINE FINDINGS Alignment: Normal.  No bachelor invagination. Skull base and vertebrae: No acute fracture. Vertebral body heights are maintained. The dens and skull base are intact. Soft tissues and spinal canal: No prevertebral fluid or swelling. No visible canal hematoma. Disc levels: Mild multilevel disc space narrowing and endplate spurring, similar to prior exam. Upper chest: No acute finding. Other: None. IMPRESSION: 1. No acute intracranial abnormality. No skull fracture. Progressive atrophy from 2015. 2. Acute minimally depressed left  nasal bone fracture. Acute minimally displaced medial left orbital wall fracture. No extraocular muscle entrapment. 3. Degenerative change in the cervical spine without acute fracture or subluxation. Electronically Signed   By: Rubye Oaks M.D.   On: 09/23/2017 01:59   Ct Cervical Spine Wo Contrast  Result Date: 09/23/2017 CLINICAL DATA:  Slip and fall with facial trauma. Laceration under left eye. Unable to assess nexus.  Nonverbal patient. EXAM: CT HEAD WITHOUT CONTRAST CT MAXILLOFACIAL WITHOUT CONTRAST CT CERVICAL SPINE WITHOUT CONTRAST TECHNIQUE: Multidetector CT imaging of the head, cervical spine, and maxillofacial structures were performed using the standard protocol without intravenous contrast. Multiplanar CT image reconstructions of the cervical spine and maxillofacial structures were also generated. COMPARISON:  CT 09/07/2013 FINDINGS: CT HEAD FINDINGS Brain: Progressive atrophy, advanced for age. Progressive ventricular dilatation likely secondary to atrophy. Moderate chronic small vessel ischemia. No intracranial hemorrhage or evidence of acute ischemia. No subdural or extra-axial fluid collection. Vascular: No hyperdense vessel or unexpected calcification. Skull: No fracture or focal lesion. Other: None. CT MAXILLOFACIAL FINDINGS Osseous: Minimally depressed left nasal bone fracture. No nasal septal hematoma. Zygomatic arches and mandibles are intact. The temporomandibular joints are congruent. Orbits: Minimally displaced fracture of the medial wall left orbit with herniation of retrobulbar fat. No entrapment. The globe is intact. Right orbit and globe are intact without acute fracture. Muscle enhanced Sinuses: Small left hemosinus. Chronic opacification of right maxillary sinus and mucous retention cysts in the left maxillary sinus. Opacification of left ethmoid air cells related to orbital fracture. No sinus fracture. Soft tissues: Left infraorbital soft tissue edema. Mild left paranasal soft tissue edema. CT CERVICAL SPINE FINDINGS Alignment: Normal.  No bachelor invagination. Skull base and vertebrae: No acute fracture. Vertebral body heights are maintained. The dens and skull base are intact. Soft tissues and spinal canal: No prevertebral fluid or swelling. No visible canal hematoma. Disc levels: Mild multilevel disc space narrowing and endplate spurring, similar to prior exam. Upper chest: No acute finding. Other:  None. IMPRESSION: 1. No acute intracranial abnormality. No skull fracture. Progressive atrophy from 2015. 2. Acute minimally depressed left nasal bone fracture. Acute minimally displaced medial left orbital wall fracture. No extraocular muscle entrapment. 3. Degenerative change in the cervical spine without acute fracture or subluxation. Electronically Signed   By: Rubye Oaks M.D.   On: 09/23/2017 01:59   Ct Maxillofacial Wo Contrast  Result Date: 09/23/2017 CLINICAL DATA:  Slip and fall with facial trauma. Laceration under left eye. Unable to assess nexus. Nonverbal patient. EXAM: CT HEAD WITHOUT CONTRAST CT MAXILLOFACIAL WITHOUT CONTRAST CT CERVICAL SPINE WITHOUT CONTRAST TECHNIQUE: Multidetector CT imaging of the head, cervical spine, and maxillofacial structures were performed using the standard protocol without intravenous contrast. Multiplanar CT image reconstructions of the cervical spine and maxillofacial structures were also generated. COMPARISON:  CT 09/07/2013 FINDINGS: CT HEAD FINDINGS Brain: Progressive atrophy, advanced for age. Progressive ventricular dilatation likely secondary to atrophy. Moderate chronic small vessel ischemia. No intracranial hemorrhage or evidence of acute ischemia. No subdural or extra-axial fluid collection. Vascular: No hyperdense vessel or unexpected calcification. Skull: No fracture or focal lesion. Other: None. CT MAXILLOFACIAL FINDINGS Osseous: Minimally depressed left nasal bone fracture. No nasal septal hematoma. Zygomatic arches and mandibles are intact. The temporomandibular joints are congruent. Orbits: Minimally displaced fracture of the medial wall left orbit with herniation of retrobulbar fat. No entrapment. The globe is intact. Right orbit and globe are intact without acute fracture. Muscle enhanced Sinuses: Small left hemosinus. Chronic opacification of right  maxillary sinus and mucous retention cysts in the left maxillary sinus. Opacification of left  ethmoid air cells related to orbital fracture. No sinus fracture. Soft tissues: Left infraorbital soft tissue edema. Mild left paranasal soft tissue edema. CT CERVICAL SPINE FINDINGS Alignment: Normal.  No bachelor invagination. Skull base and vertebrae: No acute fracture. Vertebral body heights are maintained. The dens and skull base are intact. Soft tissues and spinal canal: No prevertebral fluid or swelling. No visible canal hematoma. Disc levels: Mild multilevel disc space narrowing and endplate spurring, similar to prior exam. Upper chest: No acute finding. Other: None. IMPRESSION: 1. No acute intracranial abnormality. No skull fracture. Progressive atrophy from 2015. 2. Acute minimally depressed left nasal bone fracture. Acute minimally displaced medial left orbital wall fracture. No extraocular muscle entrapment. 3. Degenerative change in the cervical spine without acute fracture or subluxation. Electronically Signed   By: Rubye Oaks M.D.   On: 09/23/2017 01:59      .Marland KitchenLaceration Repair Date/Time: 09/23/2017 8:09 AM Performed by: Darci Current, MD Authorized by: Darci Current, MD   Consent:    Consent obtained:  Verbal   Consent given by:  Patient   Risks discussed:  Infection, pain, retained foreign body, poor cosmetic result and poor wound healing Laceration details:    Location:  Face   Length (cm):  4   Depth (mm):  3 Repair type:    Repair type:  Simple Exploration:    Hemostasis achieved with:  Direct pressure   Wound exploration: entire depth of wound probed and visualized     Contaminated: no   Treatment:    Area cleansed with:  Saline   Amount of cleaning:  Extensive   Irrigation solution:  Sterile saline   Visualized foreign bodies/material removed: no   Skin repair:    Repair method:  Tissue adhesive Approximation:    Approximation:  Close Post-procedure details:    Dressing:  Antibiotic ointment   Patient tolerance of procedure:  Tolerated well, no  immediate complications     ____________________________________________   INITIAL IMPRESSION / ASSESSMENT AND PLAN / ED COURSE  As part of my medical decision making, I reviewed the following data within the electronic MEDICAL RECORD NUMBER   62 year old male with above stated history and physical exam secondary to accidental fall with resultant facial laceration and nasal bone fracture medial orbital wall fracture as noted on CT scan.  Spoke with the patient's father regarding possible approaches to the patient's wound repair.  Dermabond was employed to repair the patient's wound with good reapproximation.  Awoke with the patient's father regarding following up with ENT secondary to nasal bone and medial orbital wall fracture.  No signs of entrapment at this time    ____________________________________________  FINAL CLINICAL IMPRESSION(S) / ED DIAGNOSES  Final diagnoses:  Closed fracture of nasal bone, initial encounter  Closed fracture of orbital wall, initial encounter (HCC)  Facial laceration, initial encounter     MEDICATIONS GIVEN DURING THIS VISIT:  Medications  lidocaine (PF) (XYLOCAINE) 1 % injection (has no administration in time range)     ED Discharge Orders        Ordered    cephALEXin (KEFLEX) 250 MG/5ML suspension  2 times daily     09/23/17 0352       Note:  This document was prepared using Dragon voice recognition software and may include unintentional dictation errors.    Darci Current, MD 09/23/17 [phone]

## 2017-09-23 NOTE — ED Triage Notes (Signed)
Pt arrived via EMS from home where EMS reports pt slipped and fell to the ground, witnessed by father whom pt lives with. Pt has approximate 1 inch laceration under the left eye and deformity to the right shoulder with abrasions. Pt is non-verbal at baseline and is under hospice care, per EMS.

## 2018-09-27 ENCOUNTER — Other Ambulatory Visit: Payer: Self-pay

## 2018-09-27 ENCOUNTER — Other Ambulatory Visit: Payer: Self-pay | Admitting: Family Medicine

## 2018-09-27 ENCOUNTER — Ambulatory Visit
Admission: RE | Admit: 2018-09-27 | Discharge: 2018-09-27 | Disposition: A | Discharge status: Home / Self Care | Payer: Medicare Other | Source: Ambulatory Visit | Attending: Family Medicine | Admitting: Family Medicine

## 2018-09-27 DIAGNOSIS — M7989 Other specified soft tissue disorders: Secondary | ICD-10-CM

## 2018-12-28 ENCOUNTER — Emergency Department
Admission: EM | Admit: 2018-12-28 | Discharge: 2018-12-28 | Disposition: A | Discharge status: Home / Self Care | Payer: Medicare Other | Attending: Emergency Medicine | Admitting: Emergency Medicine

## 2018-12-28 ENCOUNTER — Encounter: Payer: Self-pay | Admitting: Emergency Medicine

## 2018-12-28 ENCOUNTER — Other Ambulatory Visit: Payer: Self-pay

## 2018-12-28 DIAGNOSIS — Z466 Encounter for fitting and adjustment of urinary device: Secondary | ICD-10-CM | POA: Diagnosis not present

## 2018-12-28 DIAGNOSIS — Z79899 Other long term (current) drug therapy: Secondary | ICD-10-CM | POA: Diagnosis not present

## 2018-12-28 DIAGNOSIS — I10 Essential (primary) hypertension: Secondary | ICD-10-CM | POA: Diagnosis not present

## 2018-12-28 DIAGNOSIS — R339 Retention of urine, unspecified: Secondary | ICD-10-CM | POA: Diagnosis not present

## 2018-12-28 LAB — URINALYSIS, COMPLETE (UACMP) WITH MICROSCOPIC
Bilirubin Urine: NEGATIVE
Glucose, UA: NEGATIVE mg/dL
Ketones, ur: NEGATIVE mg/dL
Nitrite: POSITIVE — AB
Protein, ur: 100 mg/dL — AB
Specific Gravity, Urine: 1.01 (ref 1.005–1.030)
WBC, UA: >50 WBC/hpf — ABNORMAL HIGH (ref 0–5)
pH: 7 (ref 5.0–8.0)

## 2018-12-28 MED ORDER — SULFAMETHOXAZOLE-TRIMETHOPRIM 800-160 MG PO TABS: 1.0000 | ORAL_TABLET | Freq: Two times a day (BID) | ORAL | 0 refills | Status: AC

## 2018-12-28 NOTE — Progress Notes (Signed)
ED visit made. Patient is currently followed by [place] at home with a hospice diagnosis of encephalopathy. He is a DNR code with out facility DNR in place  the home. He was ent to the [facility] ED today for evaluation fo urinary retention with inability to have a foley placed. Hospice RN attempted to place a 12 fr foley, met resistance and patient had some bleeding. Patient seen lying on the ED stretcher, alert in no apparent distress, father Fritz Pickerel at bedside as patient is none verbal. Staff RN Manuela Schwartz st bedside, bladder scan in use, patient showing retention. She reports attempting to place a 12 fr catheter with out success. Patient had also voided some on his own. She also attempted to place an 8 fr foley, which was able to drain urine, but the balloon could not inflate. EDP Dr. Cinda Quest in during the visit. Plan is for a urology consult, Fritz Pickerel agreeable. He was very concerned that the staff was aware that they Jehovah's Witness and no blood products are to be used/given. Staff RN Manuela Schwartz made aware. Patient resting with eyes closed. Emotional support given. Writer instructed Fritz Pickerel to contact hospice for any needs. Hospice team updated.  Flo Shanks BSN, RN, [facility] [place] [facility] [phone]

## 2018-12-28 NOTE — ED Provider Notes (Addendum)
Patient has some swelling suprapubically.  When you press on his abdomen he does urinate but seems to be uncomfortable.  There is frank blood in the urine.  Additionally when you press on his abdomen to make him urinate the area around the penis that is swollen swells up further.  I will let urology know about this.  I had called urology already but he was upstairs beginning of vasectomy.  Plan is to have urology come down and evaluate the patient.   Nena Polio, MD 12/28/18 1632 I need to add that our nurse attempted to put a 8 [place] Foley in.  She got about 24 cc of urine out but it is stopped and the balloon would not inflate.  I am wondering if the patient had had his urethra perforated earlier.   Nena Polio, MD 12/28/18 [phone]

## 2018-12-28 NOTE — ED Notes (Signed)
Urology is in a procedure and will be down afterwards in approx 45 min

## 2018-12-28 NOTE — ED Notes (Signed)
Urologist inserted a 14 coude. 600cc return dark with sediment. Father taught catheter care. They have hospice in the home who will also be able to help him with it.

## 2018-12-28 NOTE — ED Triage Notes (Signed)
Home health was changing out his catheter and was unable to get the new one in.

## 2018-12-28 NOTE — ED Notes (Signed)
Father at bedside. He states the pt has hx of urinary retention and hospice was attempting to put a catheter in him and stopped when pt started bleeding. He believes it was a size 16.

## 2018-12-28 NOTE — ED Notes (Signed)
Urologist at bedside.

## 2018-12-28 NOTE — ED Provider Notes (Signed)
Urology comes to see the patient.  He is able to get coud catheter and Foley without any difficulty.  He has to flush the bladder several times because of sediment.  He will follow-up the patient in the office in 3 weeks.  My understanding that the urologist will discharge the patient and provided discharge instructions.   Nena Polio, MD 12/28/18 1816

## 2018-12-28 NOTE — Discharge Instructions (Addendum)
He stopped taking his Macrobid.  Switch to Bactrim 1 twice a day.  Follow-up with the urologist Dr. Caprice Beaver in 3 - 4 weeks.  Please call to set up the appointment.  Continue using the Foley.  Please return here for any further problems.

## 2018-12-28 NOTE — ED Provider Notes (Signed)
[facility] Emergency Department Provider Note  Time seen: 2:41 PM  I have reviewed the triage vital signs and the nursing notes.   HISTORY  Chief Complaint foley replacement   HPI Carlos Price is a 63 y.o. male with a past medical history of Down syndrome, hypertension, ulcerative colitis, presents to the emergency department with a displaced Foley catheter.  According to the father who is here with the patient home health/hospice came to the house to exchange the patient's Foley catheter.  They remove the catheter but were unable to insert a new catheter so they brought the patient to the emergency department.  Here the patient is alert and calm, no distress.  Father denies any recent fever cough or shortness of breath.   Past Medical History:  Diagnosis Date  . Down syndrome    more likley CP  . Hypertension   . Ulcerative colitis [facility](HCC)     Patient Active Problem List   Diagnosis Date Noted  . Sepsis (HCC) 01/04/2017  . Ulcerative colitis (HCC) 01/07/2016  . Dysphagia 01/07/2016  . UTI (lower urinary tract infection) 06/10/2015    Past Surgical History:  Procedure Laterality Date  . none      Prior to Admission medications   Medication Sig Start Date End Date Taking? Authorizing Provider  cephALEXin (KEFLEX) 500 MG capsule Take 1 capsule (500 mg total) by mouth every 12 (twelve) hours. 01/07/17   Enedina FinnerPatel, Sona, MD  famotidine (PEPCID) 20 MG tablet Take 20 mg by mouth 2 (two) times daily.    [provider]  mesalamine (PENTASA) 500 MG CR capsule Take 2,000 mg by mouth 2 (two) times daily.    [provider]  selenium sulfide (SELSUN) 2.5 % shampoo Apply 1 application topically once a week. Thursday    [provider]  spironolactone (ALDACTONE) 5 mg/mL SUSP oral suspension Take 25 mg by mouth daily.    [provider]  Wheat Dextrin (BENEFIBER PO) Take 10 mLs by mouth 3 (three) times daily.    [provider]    No Known Allergies  Family History  Problem Relation Age of Onset  . Muscular dystrophy Mother     Social History Social History   Tobacco Use  . Smoking status: Never Smoker  . Smokeless tobacco: Never Used  Substance Use Topics  . Alcohol use: No  . Drug use: No    Review of Systems per father Constitutional: Negative for fever. Respiratory: Negative for shortness of breath.  Negative for cough. All other ROS negative  ____________________________________________   PHYSICAL EXAM:  VITAL SIGNS: ED Triage Vitals  Enc Vitals Group     BP 12/28/18 1334 132/83     Pulse Rate 12/28/18 1334 83     Resp 12/28/18 1334 16     Temp 12/28/18 1334 97.8 F (36.6 C)     Temp src --      SpO2 12/28/18 1334 95 %     Weight 12/28/18 1336 85 lb (38.6 kg)     Height --      Head Circumference --      Peak Flow --      Pain Score 12/28/18 1335 0     Pain Loc --      Pain Edu? --      Excl. in GC? --     Constitutional: Patient is awake and alert, calm.  No distress. Eyes: Normal exam ENT      Head: Normocephalic  and atraumatic      Mouth/Throat: Mucous membranes are moist. Cardiovascular: Normal rate, regular rhythm.  Respiratory: Normal respiratory effort without tachypnea nor retractions. Breath sounds are clear  Gastrointestinal: Soft and nontender. No distention.  GU exam is normal, uncircumcised, but easily retractable foreskin, no obvious injuries to the penis. Musculoskeletal: Nontender with normal range of motion in all extremities.  Neurologic: No obvious gross deficits. Skin:  Skin is warm, dry and intact.  Psychiatric: Calm cooperative   INITIAL IMPRESSION / ASSESSMENT AND PLAN / ED COURSE  Pertinent labs & imaging results that were available during my care of the patient were reviewed by me and considered in my medical decision making (see chart for details).   Patient presents with his father for a dislodged Foley catheter.  Currently patient is  calm cooperative, no distress.  We will attempt to replace the Foley catheter in the emergency department.  As long the Foley catheter is able to be replaced patient will be discharged back to his home with his father.  Speaking to the father further, he states they were not replacing a catheter but the patient was not able to urinate this morning so the nurse was attempting to do an in and out catheterization.  Patient has since urinated in his adult diaper.  We will perform a bladder scan to see if the patient requires catheterization at this time.  Patient has over 300 cc of urine in his bladder at this time we will place a Foley catheter for the patient.  Father agreeable.  Carlos Price was evaluated in Emergency Department on 12/28/2018 for the symptoms described in the history of present illness. He was evaluated in the context of the global COVID-19 pandemic, which necessitated consideration that the patient might be at risk for infection with the SARS-CoV-2 virus that causes COVID-19. Institutional protocols and algorithms that pertain to the evaluation of patients at risk for COVID-19 are in a state of rapid change based on information released by regulatory bodies including the CDC and federal and state organizations. These policies and algorithms were followed during the patient's care in the ED.  ____________________________________________   FINAL CLINICAL IMPRESSION(S) / ED DIAGNOSES  Catheter placement Urinary retention   Harvest Dark, MD 12/28/18 1506

## 2018-12-28 NOTE — ED Notes (Signed)
Urology cart delivered. Hospice at bedside. States it was actually a size 12 they attempted earlier at home. Pt cleaned and it was noted his pubic area looked swollen. Family is unsure if that is normal. Attempted cath but met resistance. Pt bladder scanned and shows 437cc. Depends is soaked, so pressed on bladder, when pressed closer to pubis the patient urinated. rescanned and still has 350cc in bladder. Dr made aware.

## 2018-12-28 NOTE — Consult Note (Signed)
12/28/18 6:28 PM   Carlos Price [birth year] [id number]  CC: Urinary retention  HPI: I saw Mr. Carlos Price in consultation from Dr. Juliette AlcideMelinda in the emergency department for urinary retention.  He is a 63 year old male with Down syndrome and what sounds like worsening muscular dystrophy who presents to the ER with lower abdominal pain, inability to void, and bladder scan for 500 cc.  He normally void spontaneously into a diaper.  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  He has never seen a urologist before, had a catheter.  He does have a history of numerous recurrent urinary tract infections.  It looks like his bladder scan was elevated to 200cc as far back as 2017.  An outside nurse was unable to place a catheter, and he was sent to the ED.  In the ER, they were unable to place a catheter and urology was consulted.  There are no aggravating or alleviating factors.  Severity is moderate to severe.  The patient is nonverbal, and the history is obtained entirely from the patient's father.  He states he has not had any fevers.  He has never seen any blood in the patient's diaper.  He does confirm a history of recurrent urinary tract infections, most recently E. coli pansensitive from 2 to 3 days ago.  He has been on nitrofurantoin.   PMH: Past Medical History:  Diagnosis Date  . Down syndrome    more likley CP  . Hypertension   . Ulcerative colitis [facility](HCC)     Surgical History: Past Surgical History:  Procedure Laterality Date  . none     Allergies: No Known Allergies  Family History: Family History  Problem Relation Age of Onset  . Muscular dystrophy Mother     Social History:  reports that he has never smoked. He has never used smokeless tobacco. He reports that he does not drink alcohol or use drugs.  ROS: Please see flowsheet from today's date for complete review of systems.  Physical Exam: BP 132/83    Pulse 83   Temp 97.8 F (36.6 C)   Resp 16   Wt 38.6 kg   SpO2 95%   BMI 18.39 kg/m    Constitutional: Cachectic, chronically ill-appearing Cardiovascular: No clubbing, cyanosis, or edema. Respiratory: Normal respiratory effort, no increased work of breathing. GI: Palpable lower abdomen consistent with distended bladder, tender GU: Pediatric size phallus with widely patent meatus, no lesions Lymph: No cervical or inguinal lymphadenopathy. Skin: No rashes, bruises or suspicious lesions.  Laboratory Data: Reviewed  Urinalysis today nitrite positive, few bacteria, 11-20 RBCs, greater than 50 WBCs, WBC clumps Urine culture from 6/22 reportedly with pansensitive E. coli  Pertinent Imaging: None to review  Procedure: The patient was prepped and draped in standard sterile fashion.  Lidocaine was instilled into the urethra for anesthetic.  A 14 French Coloplast coud catheter passed easily into the bladder with return of dark yellow urine.  500 cc urine was drained.  There was frankly purulent thick sediment at the end.  The bladder was gently irrigated with 150 cc normal saline and irrigated and drained easily with no further sediment.   Assessment & Plan:   In summary, the patient is a [age] year old male with developmental delay, now on hospice for presumed muscular dystrophy, who presents in acute urinary retention with a long history of recurrent urinary tract infections.  I had a long conversation with the patient's  father, his primary caregiver today.  I suspect his recurrent urinary tract infections have been secondary to long term incomplete bladder emptying.  At this point, the patient is DNR, and his father(HCPOA) would like to improve his quality of life and the least invasive way as possible.  We both agreed that a chronic Foley likely would be the best option for him to reduce his recurrent urinary tract infections, as well as lower abdominal pain from incomplete bladder emptying.   Follow-up in clinic in 3 to 4 weeks for catheter change, and discussion of chronic catheter use Recommend 10 days Bactrim DS twice daily, follow-up cultures  Billey Co, MD  [facility] [address] [place], [place] [address] [phone]

## 2018-12-28 NOTE — ED Notes (Signed)
Inserted pediatric foley size 8 - but was unable to blow up balloon, and only 24cc urine came out. Removed cath and requested dr to see pt for possible urology consult.

## 2018-12-29 ENCOUNTER — Telehealth: Payer: Self-pay | Admitting: Urology

## 2018-12-29 LAB — URINE CULTURE: Culture: NO GROWTH

## 2018-12-29 NOTE — Telephone Encounter (Signed)
App made and father has been notified  Sharyn Lull

## 2018-12-29 NOTE — Telephone Encounter (Signed)
-----   Message from Billey Co, MD sent at 12/28/2018  6:37 PM EDT ----- Regarding: follow up Please set up clinic follow up in 3-4 weeks for MD 15 min visit with me. Patient is handicapped and non-verbal, and his father is POA.  Nickolas Madrid, MD 12/28/2018

## 2018-12-29 NOTE — Telephone Encounter (Signed)
Pt was on Macrobid and was switched to Bactrim.  Dad is concerned about the side effects of Bactrim.  He is concerned about Colitis.  Please give Dad a call.

## 2018-12-29 NOTE — Telephone Encounter (Signed)
Pt's Dad called back and said please disregard previous message.  No need for a return call.

## 2019-01-04 NOTE — Telephone Encounter (Signed)
Can we change abx or should he have him take a probiotic?

## 2019-01-04 NOTE — Telephone Encounter (Signed)
Spoke to patient and per [facility] patient is able to use Nitrofurantoin 100 BID if the Bactrim is giving patient diarrhea. Patients father states he has been on Nitro several times and just wants to keep him on Bactrim but only 1 time daily. I told him he could try that and if it still is causing stomach issues he can change to Nitro. Patient's father states he has several of the pills left from last time he took them. He states he will call on Monday if the medication had to be changed.

## 2019-01-04 NOTE — Telephone Encounter (Signed)
Pt was on Macrobid and was switched to Bactrim.  Dad is concerned about the side effects of Bactrim.  he has been on it since 12-29-18 and has had diarrhea since he started staking it. He is concerned that he is on to strong of a dose and wants to know what to do? Please call back.

## 2019-02-01 ENCOUNTER — Encounter: Payer: Self-pay | Admitting: Urology

## 2019-02-01 ENCOUNTER — Ambulatory Visit (INDEPENDENT_AMBULATORY_CARE_PROVIDER_SITE_OTHER): Payer: Medicare Other | Admitting: Urology

## 2019-02-01 ENCOUNTER — Other Ambulatory Visit: Payer: Self-pay

## 2019-02-01 VITALS — BP 108/68 | HR 56 | Ht <= 58 in | Wt 75.0 lb

## 2019-02-01 DIAGNOSIS — N390 Urinary tract infection, site not specified: Secondary | ICD-10-CM | POA: Diagnosis not present

## 2019-02-01 DIAGNOSIS — Z87448 Personal history of other diseases of urinary system: Secondary | ICD-10-CM | POA: Diagnosis not present

## 2019-02-01 NOTE — Patient Instructions (Signed)
Indwelling Urinary Catheter Care, Adult °An indwelling urinary catheter is a thin tube that is put into your bladder. The tube helps to drain pee (urine) out of your body. The tube goes in through your urethra. Your urethra is where pee comes out of your body. Your pee will come out through the catheter, then it will go into a bag (drainage bag). °Take good care of your catheter so it will work well. °How to wear your catheter and bag °Supplies needed °· Sticky tape (adhesive tape) or a leg strap. °· Alcohol wipe or soap and water (if you use tape). °· A clean towel (if you use tape). °· Large overnight bag. °· Smaller bag (leg bag). °Wearing your catheter °Attach your catheter to your leg with tape or a leg strap. °· Make sure the catheter is not pulled tight. °· If a leg strap gets wet, take it off and put on a dry strap. °· If you use tape to hold the bag on your leg: °1. Use an alcohol wipe or soap and water to wash your skin where the tape made it sticky before. °2. Use a clean towel to pat-dry that skin. °3. Use new tape to make the bag stay on your leg. °Wearing your bags °You should have been given a large overnight bag. °· You may wear the overnight bag in the day or night. °· Always have the overnight bag lower than your bladder.  Do not let the bag touch the floor. °· Before you go to sleep, put a clean plastic bag in a wastebasket. Then hang the overnight bag inside the wastebasket. °You should also have a smaller leg bag that fits under your clothes. °· Always wear the leg bag below your knee. °· Do not wear your leg bag at night. °How to care for your skin and catheter °Supplies needed °· A clean washcloth. °· Water and mild soap. °· A clean towel. °Caring for your skin and catheter ° °  ° °· Clean the skin around your catheter every day: °? Wash your hands with soap and water. °? Wet a clean washcloth in warm water and mild soap. °? Clean the skin around your urethra. °? If you are male: °? Gently  spread the folds of skin around your vagina (labia). °? With the washcloth in your other hand, wipe the inner side of your labia on each side. Wipe from front to back. °? If you are male: °? Pull back any skin that covers the end of your penis (foreskin). °? With the washcloth in your other hand, wipe your penis in small circles. Start wiping at the tip of your penis, then move away from the catheter. °? Move the foreskin back in place, if needed. °? With your free hand, hold the catheter close to where it goes into your body. °? Keep holding the catheter during cleaning so it does not get pulled out. °? With the washcloth in your other hand, clean the catheter. °? Only wipe downward on the catheter. °? Do not wipe upward toward your body. Doing this may push germs into your urethra and cause infection. °? Use a clean towel to pat-dry the catheter and the skin around it. Make sure to wipe off all soap. °? Wash your hands with soap and water. °· Shower every day. Do not take baths. °· Do not use cream, ointment, or lotion on the area where the catheter goes into your body, unless your doctor tells you   to. °· Do not use powders, sprays, or lotions on your genital area. °· Check your skin around the catheter every day for signs of infection. Check for: °? Redness, swelling, or pain. °? Fluid or blood. °? Warmth. °? Pus or a bad smell. °How to empty the bag °Supplies needed °· Rubbing alcohol. °· Gauze pad or cotton ball. °· Tape or a leg strap. °Emptying the bag °Pour the pee out of your bag when it is ?-½ full, or at least 2-3 times a day. Do this for your overnight bag and your leg bag. °1. Wash your hands with soap and water. °2. Separate (detach) the bag from your leg. °3. Hold the bag over the toilet or a clean pail. Keep the bag lower than your hips and bladder. This is so the pee (urine) does not go back into the tube. °4. Open the pour spout. It is at the bottom of the bag. °5. Empty the pee into the toilet or  pail. Do not let the pour spout touch any surface. °6. Put rubbing alcohol on a gauze pad or cotton ball. °7. Use the gauze pad or cotton ball to clean the pour spout. °8. Close the pour spout. °9. Attach the bag to your leg with tape or a leg strap. °10. Wash your hands with soap and water. °Follow instructions for cleaning the drainage bag: °· From the product maker. °· As told by your doctor. °How to change the bag °Supplies needed °· Alcohol wipes. °· A clean bag. °· Tape or a leg strap. °Changing the bag °Replace your bag when it starts to leak, smell bad, or look dirty. °1. Wash your hands with soap and water. °2. Separate the dirty bag from your leg. °3. Pinch the catheter with your fingers so that pee does not spill out. °4. Separate the catheter tube from the bag tube where these tubes connect (at the connection valve). Do not let the tubes touch any surface. °5. Clean the end of the catheter tube with an alcohol wipe. Use a different alcohol wipe to clean the end of the bag tube. °6. Connect the catheter tube to the tube of the clean bag. °7. Attach the clean bag to your leg with tape or a leg strap. Do not make the bag tight on your leg. °8. Wash your hands with soap and water. °General rules ° °· Never pull on your catheter. Never try to take it out. Doing that can hurt you. °· Always wash your hands before and after you touch your catheter or bag. Use a mild, fragrance-free soap. If you do not have soap and water, use hand sanitizer. °· Always make sure there are no twists or bends (kinks) in the catheter tube. °· Always make sure there are no leaks in the catheter or bag. °· Drink enough fluid to keep your pee pale yellow. °· Do not take baths, swim, or use a hot tub. °· If you are male, wipe from front to back after you poop (have a bowel movement). °Contact a doctor if: °· Your pee is cloudy. °· Your pee smells worse than usual. °· Your catheter gets clogged. °· Your catheter leaks. °· Your bladder  feels full. °Get help right away if: °· You have redness, swelling, or pain where the catheter goes into your body. °· You have fluid, blood, pus, or a bad smell coming from the area where the catheter goes into your body. °· Your skin feels warm where   the catheter goes into your body. °· You have a fever. °· You have pain in your: °? Belly (abdomen). °? Legs. °? Lower back. °? Bladder. °· You see blood in the catheter. °· Your pee is pink or red. °· You feel sick to your stomach (nauseous). °· You throw up (vomit). °· You have chills. °· Your pee is not draining into the bag. °· Your catheter gets pulled out. °Summary °· An indwelling urinary catheter is a thin tube that is placed into the bladder to help drain pee (urine) out of the body. °· The catheter is placed into the part of the body that drains pee from the bladder (urethra). °· Taking good care of your catheter will keep it working properly and help prevent problems. °· Always wash your hands before and after touching your catheter or bag. °· Never pull on your catheter or try to take it out. °This information is not intended to replace advice given to you by your health care provider. Make sure you discuss any questions you have with your health care provider. °Document Released: 10/16/2012 Document Revised: 10/13/2018 Document Reviewed: 02/04/2017 °Elsevier Patient Education © 2020 [employer] ° °

## 2019-02-01 NOTE — Progress Notes (Signed)
   02/01/2019 4:58 PM   Carlos Price 12/11/1955 [id number]  Reason for visit: Follow up recurrent UTIs and urinary retention  HPI: I saw Carlos Price back in urology clinic today for follow-up of recurrent UTIs and urinary retention.  To briefly summarize he is a 64 year old male with severe Down syndrome who is cared for by his father, and is on hospice secondary to failure to thrive who I saw in the ER in 12/28/2018 for urinary retention.  It sounds like he has had a long history of incomplete bladder emptying and recurrent urinary tract tract infections with PVRs greater than 300 cc.  I placed a Fortuna catheter in the ED, with return of frankly purulent urine.  He was discharged on antibiotics and has had his catheter indwelling since then.  Per his father, the patient is tolerating the catheter very well.  It has significantly improved his quality of life in terms of urinary leakage, as well as recurrent urinary infections thus far.  His father would like to continue bladder management with chronic Foley.   ROS: Please see flowsheet from today's date for complete review of systems.  Physical Exam: BP 108/68 (BP Location: Left Arm, Patient Position: Sitting)   Pulse (!) 56   Ht 4\' 9"  (1.448 m)   Wt 75 lb (34 kg) Comment: per patients father (POA)  BMI 16.23 kg/m    The indwelling 51F coloplast foley was removed. The patient was prepped and draped in standard sterile fashion. A 51F 2-way foley passed easily into the bladder with return of yellow urine. 10cc placed in balloon. Foley irrigated easily.  Assessment & Plan:   In summary, he is a 63 year old male with severe Down syndrome cared for by his father with a long history of urinary retention and recurrent UTIs managed by outside providers.  I had a long discussion with his father his caregiver today, and he is in agreement with continuing a chronic Foley for bladder management.  I think this is very reasonable with  the patient's end-of-life care, and history of multiple hospitalizations for UTI, and likely pain and hypertension secondary to urinary retention.  We discussed basic Foley catheter care, and the need for change every month.  We also discussed suprapubic tube and risks and benefits.  A 14 French standard Foley passed easily into the bladder today.  RTC q4 weeks for foley change with nurse RTC with me 6 months   A total of 15 minutes were spent face-to-face with the patient, greater than 50% was spent in patient education, counseling, and coordination of care regarding recurrent UTIs and urinary retention, and long-term bladder management strategies.   Billey Co, [facility] [address], [place], [place] [address] [phone]

## 2019-02-02 ENCOUNTER — Telehealth: Payer: Self-pay | Admitting: Urology

## 2019-02-02 NOTE — Telephone Encounter (Signed)
Pt father LMOM and states that he is noticing pus around the head of his penis. Please advise.

## 2019-02-02 NOTE — Telephone Encounter (Signed)
Returned call to patient's father. Advised that mucous is not uncommon and to keep cleaned with warm soapy water. Patients father verbalized satisfaction and understanding.

## 2019-03-02 ENCOUNTER — Ambulatory Visit (INDEPENDENT_AMBULATORY_CARE_PROVIDER_SITE_OTHER): Payer: Medicare Other | Admitting: *Deleted

## 2019-03-02 ENCOUNTER — Other Ambulatory Visit: Payer: Self-pay

## 2019-03-02 DIAGNOSIS — Z87448 Personal history of other diseases of urinary system: Secondary | ICD-10-CM

## 2019-03-02 NOTE — Progress Notes (Signed)
Cath Change/ Replacement  Patient is present today for a catheter change due to urinary retention.  19ml of water was removed from the balloon, a 14FR  foley cath was removed with out difficulty.  Patient was cleaned and prepped in a sterile fashion with betadine and 2% lidocaine jelly was instilled into the urethra. A 14 FR silocone Coloplast cath was replaced into the bladder no complications were noted Urine return was noted 82ml and urine was yellow in color. The balloon was filled with 77ml of sterile water. Attached to his bag at the father's request for drainage.  Patient was given proper instruction on catheter care.    Performed by: Verlene Mayer, [place], CMA  Follow up: One month  Patient's Father requested Hospice change catheters-letter was given to patient

## 2019-06-04 ENCOUNTER — Other Ambulatory Visit: Payer: Self-pay

## 2019-08-06 ENCOUNTER — Ambulatory Visit: Payer: Medicare Other | Admitting: Urology

## 2019-11-13 ENCOUNTER — Telehealth: Payer: Self-pay | Admitting: Physician Assistant

## 2019-11-13 NOTE — Telephone Encounter (Signed)
Attempted to call patient about homebound COVID19 vaccination program.  No answer.  Left voice mail.   Manson Passey, PA - C

## 2019-11-15 ENCOUNTER — Telehealth: Payer: Self-pay | Admitting: Physician Assistant

## 2019-11-15 NOTE — Telephone Encounter (Signed)
Hx obtained from father Jaceon Heiberger.

## 2019-11-15 NOTE — Telephone Encounter (Signed)
I connected by phone with Ramonita Lab and/or patient's caregiver on 11/15/2019 at 3:00 PM to discuss the potential vaccination through our Homebound vaccination initiative.   Prevaccination Checklist for COVID-19 Vaccines  1.  Are you feeling sick today? no  2.  Have you ever received a dose of a COVID-19 vaccine?  no      If yes, which one? None   3.  Have you ever had an allergic reaction: (This would include a severe reaction [ e.g., anaphylaxis] that required treatment with epinephrine or EpiPen or that caused you to go to the hospital.  It would also include an allergic reaction that occurred within 4 hours that caused hives, swelling, or respiratory distress, including wheezing.) A.  A previous dose of COVID-19 vaccine. no  B.  A vaccine or injectable therapy that contains multiple components, one of which is a COVID-19 vaccine component, but it is not known which component elicited the immediate reaction. no  C.  Are you allergic to polyethylene glycol? no   4.  Have you ever had an allergic reaction to another vaccine (other than COVID-19 vaccine) or an injectable medication? (This would include a severe reaction [ e.g., anaphylaxis] that required treatment with epinephrine or EpiPen or that caused you to go to the hospital.  It would also include an allergic reaction that occurred within 4 hours that caused hives, swelling, or respiratory distress, including wheezing.)  no   5.  Have you ever had a severe allergic reaction (e.g., anaphylaxis) to something other than a component of the COVID-19 vaccine, or any vaccine or injectable medication?  This would include food, pet, venom, environmental, or oral medication allergies.  yes - Rash on Tamsulosin    6.  Have you received any vaccine in the last 14 days? no   7.  Have you ever had a positive test for COVID-19 or has a doctor ever told you that you had COVID-19?  no   8.  Have you received passive antibody therapy (monoclonal  antibodies or convalescent serum) as a treatment for COVID-19? no   9.  Do you have a weakened immune system caused by something such as HIV infection or cancer or do you take immunosuppressive drugs or therapies?  no   10.  Do you have a bleeding disorder or are you taking a blood thinner? no   11.  Are you pregnant or breast-feeding? no   12.  Do you have dermal fillers? no    This patient is a 64 y.o. male that meets the FDA criteria to receive homebound vaccination. Patient or parent/caregiver understands they have the option to accept or refuse homebound vaccination.  Patient passed the pre-screening checklist and would like to proceed with homebound vaccination.  Based on questionnaire above, I recommend the patient be observed for 30 minutes.  There are an estimated # 0 of other household members/caregivers who are also interested in receiving the vaccine.   I will send the patient's information to our scheduling team who will reach out to schedule the patient and potential caregiver/family members for homebound vaccination.    Madalynne Gutmann 11/15/2019 3:00 PM

## 2019-11-28 ENCOUNTER — Ambulatory Visit: Payer: Medicare Other | Attending: Critical Care Medicine

## 2019-11-28 DIAGNOSIS — Z23 Encounter for immunization: Secondary | ICD-10-CM

## 2019-11-28 NOTE — Progress Notes (Signed)
   Covid-19 Vaccination Clinic  Name:  Jaloni Davoli    MRN: [id number] DOB: May 28, 1956  11/28/2019  Mr. Eidem was observed post Covid-19 immunization for 15 minutes without incident. He was provided with Vaccine Information Sheet and instruction to access the V-Safe system.   Mr. Kropp was instructed to call 911 with any severe reactions post vaccine: Marland Kitchen Difficulty breathing  . Swelling of face and throat  . A fast heartbeat  . A bad rash all over body  . Dizziness and weakness   Immunizations Administered    Name Date Dose VIS Date Route   Moderna COVID-19 Vaccine 11/28/2019  1:58 PM 0.5 mL 06/2019 Intramuscular   Manufacturer: Moderna   Lot: [id number]   NDC: 37048-889-16

## 2019-12-26 ENCOUNTER — Ambulatory Visit: Payer: Medicare Other | Attending: Critical Care Medicine

## 2019-12-26 DIAGNOSIS — Z23 Encounter for immunization: Secondary | ICD-10-CM

## 2019-12-26 NOTE — Progress Notes (Signed)
   Covid-19 Vaccination Clinic  Name:  Carlos Price    MRN: [id number] DOB: 10-16-1955  12/26/2019  Carlos Price was observed post Covid-19 immunization for 15 minutes without incident. He was provided with Vaccine Information Sheet and instruction to access the V-Safe system.   Carlos Price was instructed to call 911 with any severe reactions post vaccine: Marland Kitchen Difficulty breathing  . Swelling of face and throat  . A fast heartbeat  . A bad rash all over body  . Dizziness and weakness   Immunizations Administered    Name Date Dose VIS Date Route   Moderna COVID-19 Vaccine 12/26/2019 11:44 AM 0.5 mL 06/2019 Intramuscular   Manufacturer: Moderna   Lot: [id number]   NDC: 22633-354-56

## 2020-01-19 IMAGING — RF DG SWALLOWING FUNCTION
3 series · 12 of 12 positions shown · non-contrast
Comparison: None.

CLINICAL DATA: Dysphagia

EXAM:
MODIFIED BARIUM SWALLOW
TECHNIQUE: Different consistencies of barium were administered orally to the
patient by the Speech Pathologist. Imaging of the pharynx was
performed in the lateral projection.
FLUOROSCOPY TIME:  Fluoroscopy Time:  2 minutes 30 seconds
Radiation Exposure Index (if provided by the fluoroscopic device):
4.1 mGy
Number of Acquired Spot Images: 0

[Series 1: cp_standard · 0.25mm/px · 4 of 64 frames shown (1 of 3)]
[frame 10/64]
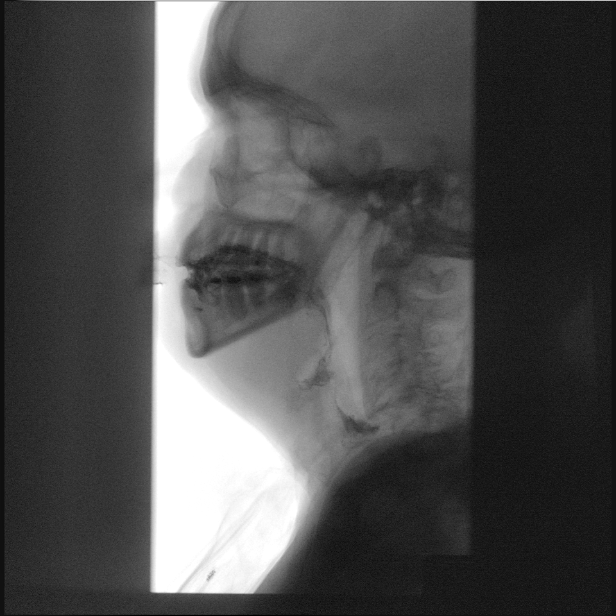
[frame 33/64]
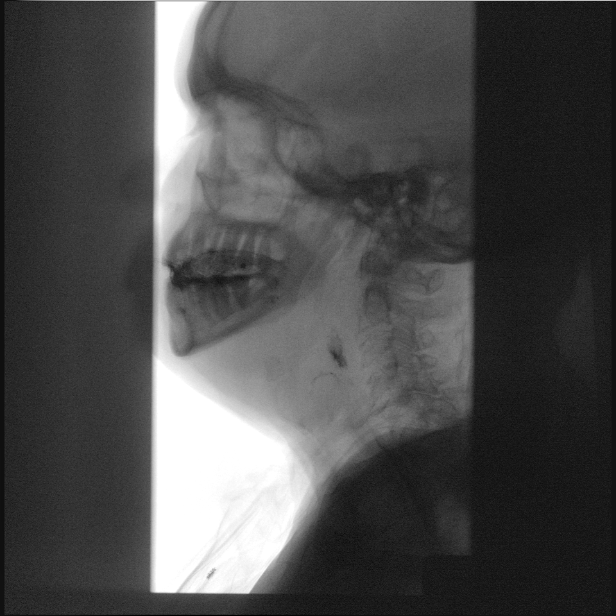
[frame 55/64]
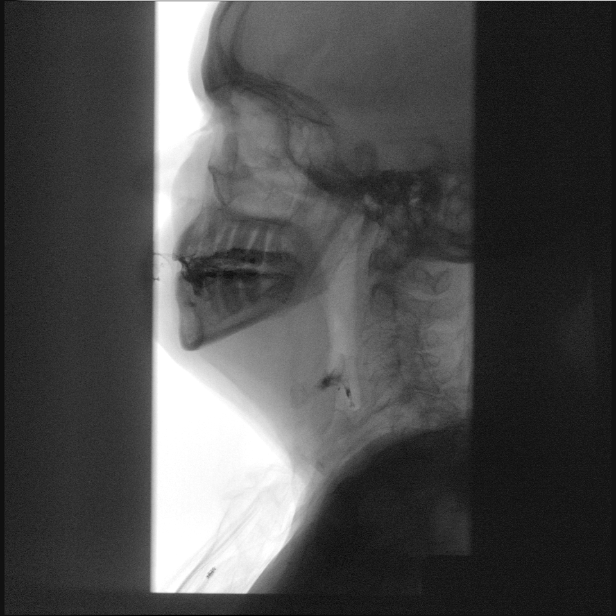
[frame 64/64]
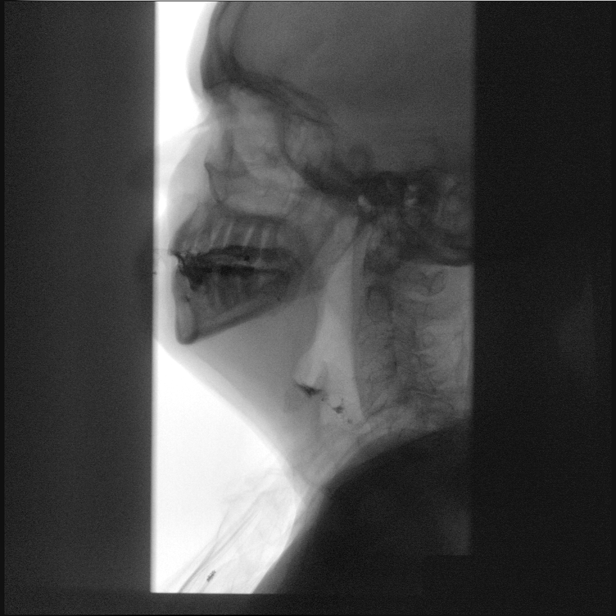

[Series 2: cp_standard · 0.25mm/px · 4 of 108 frames shown (2 of 3)]
[frame 5/108]
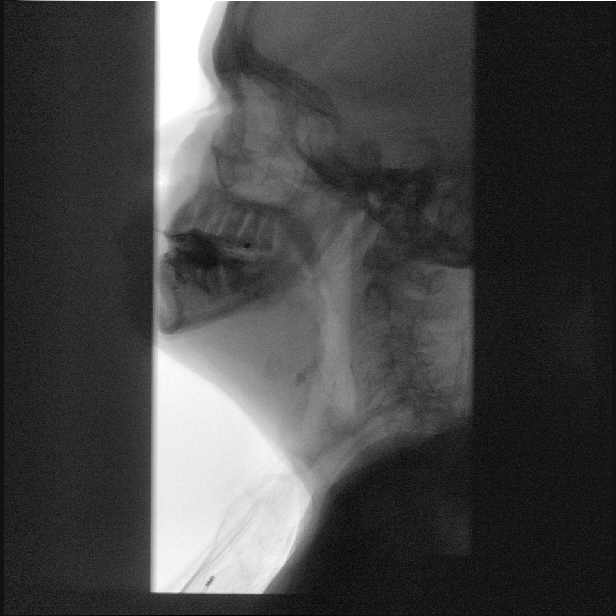
[frame 17/108]
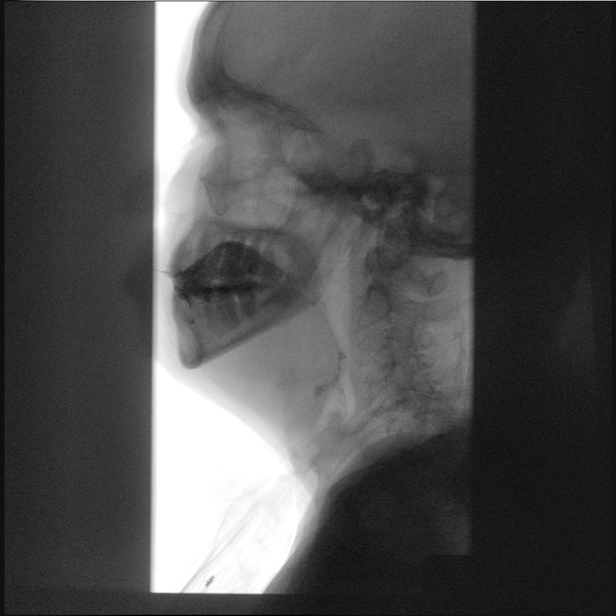
[frame 55/108]
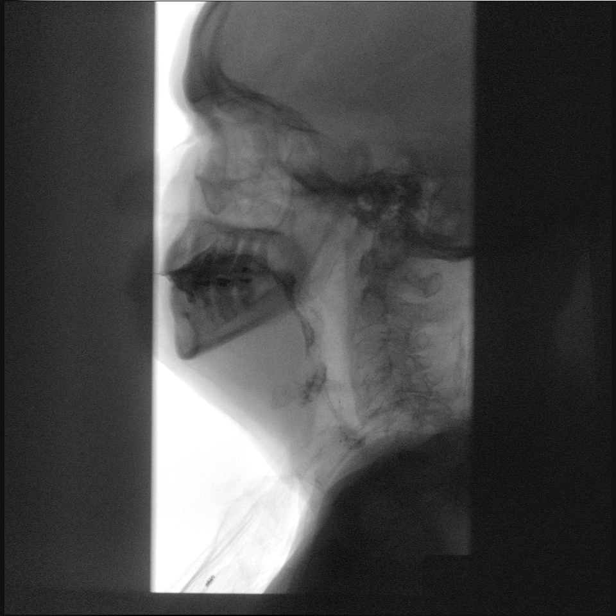
[frame 92/108]
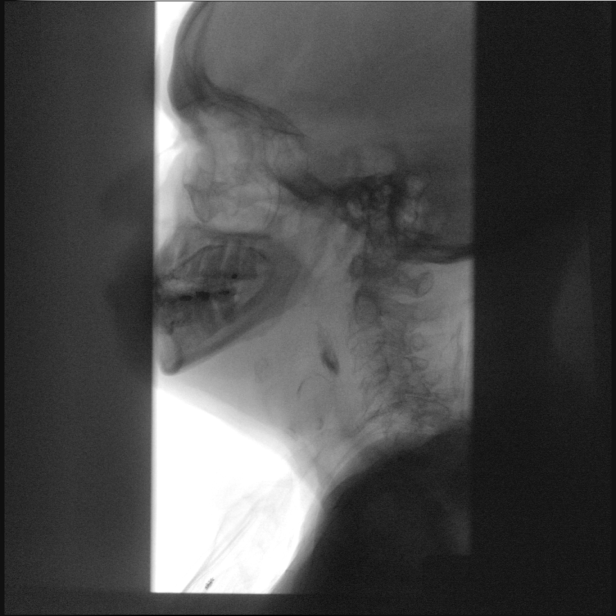

[Series 3: cp_standard · 0.25mm/px · 4 of 398 frames shown (3 of 3)]
[frame 1/398]
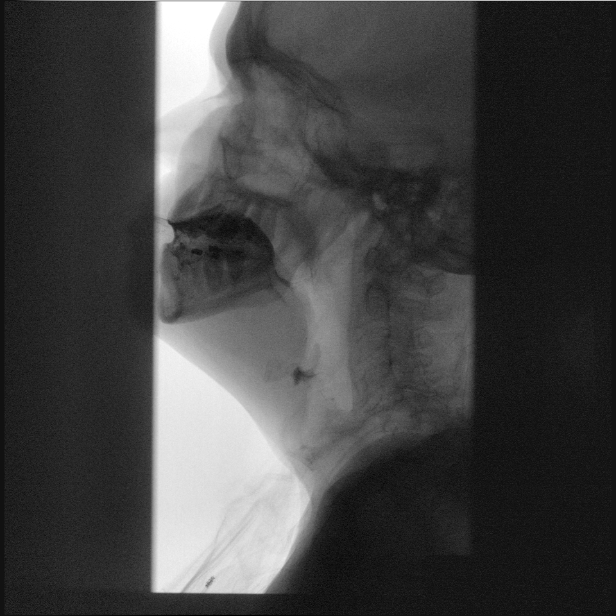
[frame 60/398]
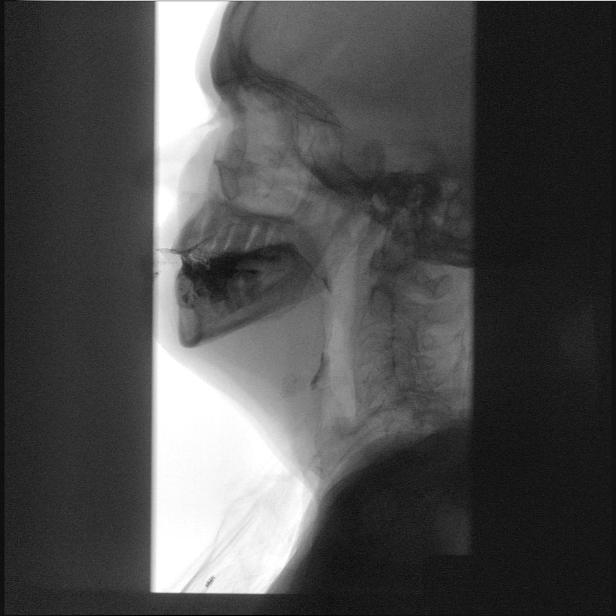
[frame 200/398]
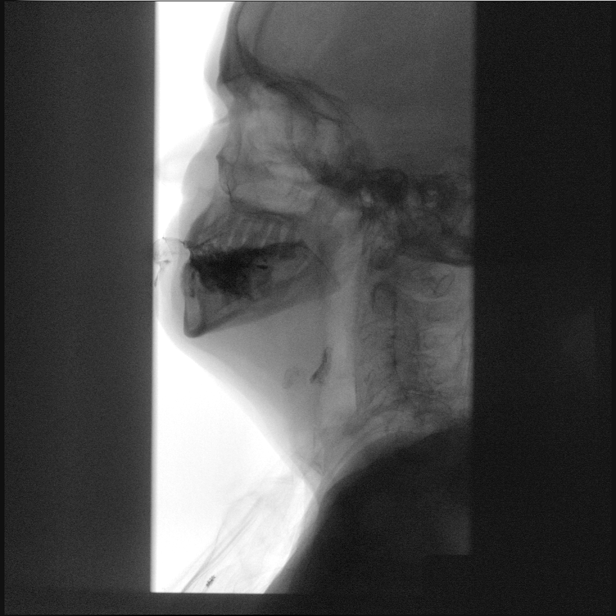
[frame 339/398]
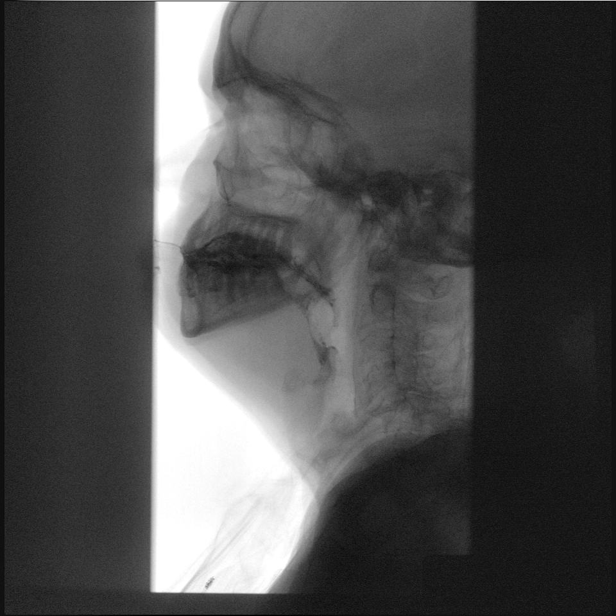

[12 of 12 positions shown; findings below may reference images not displayed]

FINDINGS: Honey-significantly delayed oral phase with delayed oropharyngeal
transfer. Trace laryngeal penetration and small amount of aspiration
with cough reflex.

Gurule?Ashu significantly delayed oral phase with delayed oropharyngeal
transfer. Trace laryngeal penetration and small amount of aspiration
with cough reflex.
IMPRESSION: Modified barium swallow as described above.

Please refer to the Speech Pathologists report for complete details
and recommendations.

## 2020-03-05 DEATH — deceased
